# Patient Record
Sex: Female | Born: 1990
Health system: Southern US, Community
[De-identification: ages and names within clinical notes are randomized; demographics above are authoritative.]

---

## 2012-07-19 ENCOUNTER — Ambulatory Visit: Payer: Self-pay | Admitting: Family

## 2012-07-28 ENCOUNTER — Ambulatory Visit: Payer: Self-pay | Admitting: Family

## 2014-12-01 ENCOUNTER — Inpatient Hospital Stay (HOSPITAL_COMMUNITY): Admission: AD | Admit: 2014-12-01 | Payer: Self-pay | Source: Ambulatory Visit | Admitting: Obstetrics and Gynecology

## 2015-04-17 ENCOUNTER — Emergency Department (HOSPITAL_BASED_OUTPATIENT_CLINIC_OR_DEPARTMENT_OTHER)
Admission: EM | Admit: 2015-04-17 | Discharge: 2015-04-17 | Disposition: A | Discharge status: Home / Self Care | Payer: Managed Care, Other (non HMO) | Attending: Emergency Medicine | Admitting: Emergency Medicine

## 2015-04-17 ENCOUNTER — Encounter (HOSPITAL_BASED_OUTPATIENT_CLINIC_OR_DEPARTMENT_OTHER): Payer: Self-pay | Admitting: Emergency Medicine

## 2015-04-17 DIAGNOSIS — H6122 Impacted cerumen, left ear: Secondary | ICD-10-CM | POA: Insufficient documentation

## 2015-04-17 DIAGNOSIS — H9202 Otalgia, left ear: Secondary | ICD-10-CM | POA: Diagnosis present

## 2015-04-17 NOTE — ED Notes (Signed)
Pt refused second ear irrigation

## 2015-04-17 NOTE — ED Provider Notes (Signed)
CSN: [id number]     Arrival date & time 04/17/15  1840 History  By signing my name below, I, Phillis Haggis, attest that this documentation has been prepared under the direction and in the presence of Geoffery Lyons, MD. Electronically Signed: Phillis Haggis, ED Scribe. 04/17/2015. 6:55 PM.  Chief Complaint  Patient presents with  . Otalgia   Patient is a 24 y.o. female presenting with ear pain. The history is provided by the patient. No language interpreter was used.  Otalgia Location:  Left Quality:  Pressure (fullness) Severity:  Mild Onset quality:  Sudden Duration:  3 days Timing:  Constant Progression:  Worsening Chronicity:  New Context: foreign body (Q-Tip)   Ineffective treatments:  None tried Associated symptoms: hearing loss (mild)   Associated symptoms: no ear discharge   HPI Comments: Ashley Thompson is a 24 y.o. female who presents to the Emergency Department complaining of gradually worsening, constant left otalgia and "fullness" onset 3 days ago. Pt states that she may have pushed earwax into her ear canal after using a Q-Tip and has been feeling pain ever since. She reports that it is hard to hear due to the "fullness." She states hx of similar symptoms, but that it typically can pop on her own. She denies trying anything for her symptoms. She denies drainage from the area or right otalgia.   History reviewed. No pertinent past medical history. History reviewed. No pertinent past surgical history. History reviewed. No pertinent family history. Social History  Substance Use Topics  . Smoking status: Never Smoker   . Smokeless tobacco: None  . Alcohol Use: No   OB History    No data available     Review of Systems  HENT: Positive for ear pain and hearing loss (mild). Negative for ear discharge.   All other systems reviewed and are negative.  Allergies  Review of patient's allergies indicates no known allergies.  Home Medications   Prior to Admission  medications   Not on File   BP 130/93 mmHg  Pulse 81  Temp(Src) 98.5 F (36.9 C) (Oral)  Resp 16  Ht  (1.626 m)  Wt 265 lb (120.203 kg)  BMI 45.46 kg/m2  SpO2 100%  LMP 03/23/2015 (Approximate)   Physical Exam  Constitutional: She is oriented to person, place, and time. She appears well-developed and well-nourished.  HENT:  Head: Normocephalic.  Left ear canal has what appears to be a cerumen impaction, obscuring the TM  Eyes: EOM are normal.  Neck: Normal range of motion.  Pulmonary/Chest: Effort normal.  Abdominal: She exhibits no distension.  Musculoskeletal: Normal range of motion.  Neurological: She is alert and oriented to person, place, and time.  Psychiatric: She has a normal mood and affect.  Nursing note and vitals reviewed.   ED Course  Procedures (including critical care time) DIAGNOSTIC STUDIES: Oxygen Saturation is 100% on RA, normal by my interpretation.    COORDINATION OF CARE: 6:57 PM-Discussed treatment plan which includes removal of cerumen impaction with pt at bedside and pt agreed to plan.    Labs Review Labs Reviewed - No data to display  Imaging Review No results found. I have personally reviewed and evaluated these images and lab results as part of my medical decision-making.   EKG Interpretation None      MDM   Final diagnoses:  None    Patient has a cerumen impaction. She allowed [place] to attempt one irrigation which relieved some earwax. There is still a cerumen  impaction present. A second attempt was to be made, however the patient began to hyperventilate and nearly experienced a panic attack. She is refusing further attempts. At this point, she will be discharged with instructions to purchase over-the-counter Debrox. She can return as needed.  I personally performed the services described in this documentation, which was scribed in my presence. The recorded information has been reviewed and is accurate.       Geoffery Lyonsouglas Nykia Turko,  MD 04/17/15 2013

## 2015-04-17 NOTE — ED Notes (Signed)
Patient states that she woke up a few days ago and has some pressure and "fullness" to her left ear

## 2015-04-17 NOTE — Discharge Instructions (Signed)
You can purchase over-the-counter Debrox for ear wax removal.  Return to the ER if you experience further problems.   Cerumen Impaction The structures of the external ear canal secrete a waxy substance known as cerumen. Excess cerumen can build up in the ear canal, causing a condition known as cerumen impaction. Cerumen impaction can cause ear pain and disrupt the function of the ear. The rate of cerumen production differs for each individual. In certain individuals, the configuration of the ear canal may decrease his or her ability to naturally remove cerumen. CAUSES Cerumen impaction is caused by excessive cerumen production or buildup. RISK FACTORS  Frequent use of swabs to clean ears.  Having narrow ear canals.  Having eczema.  Being dehydrated. SIGNS AND SYMPTOMS  Diminished hearing.  Ear drainage.  Ear pain.  Ear itch. TREATMENT Treatment may involve:  Over-the-counter or prescription ear drops to soften the cerumen.  Removal of cerumen by a health care provider. This may be done with:  Irrigation with warm water. This is the most common method of removal.  Ear curettes and other instruments.  Surgery. This may be done in severe cases. HOME CARE INSTRUCTIONS  Take medicines only as directed by your health care provider.  Do not insert objects into the ear with the intent of cleaning the ear. PREVENTION  Do not insert objects into the ear, even with the intent of cleaning the ear. Removing cerumen as a part of normal hygiene is not necessary, and the use of swabs in the ear canal is not recommended.  Drink enough water to keep your urine clear or pale yellow.  Control your eczema if you have it. SEEK MEDICAL CARE IF:  You develop ear pain.  You develop bleeding from the ear.  The cerumen does not clear after you use ear drops as directed.   This information is not intended to replace advice given to you by your health care provider. Make sure you  discuss any questions you have with your health care provider.   Document Released: 06/12/2004 Document Revised: 05/26/2014 Document Reviewed: 12/20/2014 Elsevier Interactive Patient Education [employer]2016 [employer]

## 2015-04-19 ENCOUNTER — Emergency Department (HOSPITAL_BASED_OUTPATIENT_CLINIC_OR_DEPARTMENT_OTHER)
Admission: EM | Admit: 2015-04-19 | Discharge: 2015-04-19 | Disposition: A | Discharge status: Home / Self Care | Payer: Managed Care, Other (non HMO) | Attending: Emergency Medicine | Admitting: Emergency Medicine

## 2015-04-19 ENCOUNTER — Encounter (HOSPITAL_BASED_OUTPATIENT_CLINIC_OR_DEPARTMENT_OTHER): Payer: Self-pay | Admitting: *Deleted

## 2015-04-19 DIAGNOSIS — R200 Anesthesia of skin: Secondary | ICD-10-CM | POA: Diagnosis not present

## 2015-04-19 DIAGNOSIS — F419 Anxiety disorder, unspecified: Secondary | ICD-10-CM | POA: Diagnosis present

## 2015-04-19 DIAGNOSIS — R0602 Shortness of breath: Secondary | ICD-10-CM | POA: Diagnosis not present

## 2015-04-19 DIAGNOSIS — F410 Panic disorder [episodic paroxysmal anxiety] without agoraphobia: Secondary | ICD-10-CM | POA: Insufficient documentation

## 2015-04-19 DIAGNOSIS — H538 Other visual disturbances: Secondary | ICD-10-CM | POA: Diagnosis not present

## 2015-04-19 DIAGNOSIS — R110 Nausea: Secondary | ICD-10-CM | POA: Insufficient documentation

## 2015-04-19 DIAGNOSIS — R000 Tachycardia, unspecified: Secondary | ICD-10-CM | POA: Diagnosis not present

## 2015-04-19 DIAGNOSIS — R51 Headache: Secondary | ICD-10-CM | POA: Insufficient documentation

## 2015-04-19 LAB — CBC WITH DIFFERENTIAL/PLATELET
BASOS PCT: 0 %
Basophils Absolute: 0 10*3/uL (ref 0.0–0.1)
EOS ABS: 0 10*3/uL (ref 0.0–0.7)
Eosinophils Relative: 0 %
HCT: 35 % — ABNORMAL LOW (ref 36.0–46.0)
Hemoglobin: 10.6 g/dL — ABNORMAL LOW (ref 12.0–15.0)
LYMPHS ABS: 1.5 10*3/uL (ref 0.7–4.0)
Lymphocytes Relative: 20 %
MCH: 19.6 pg — AB (ref 26.0–34.0)
MCHC: 30.3 g/dL (ref 30.0–36.0)
MCV: 64.6 fL — AB (ref 78.0–100.0)
MONO ABS: 0.5 10*3/uL (ref 0.1–1.0)
Monocytes Relative: 7 %
NEUTROS PCT: 73 %
Neutro Abs: 5.7 10*3/uL (ref 1.7–7.7)
PLATELETS: 313 10*3/uL (ref 150–400)
RBC: 5.42 MIL/uL — ABNORMAL HIGH (ref 3.87–5.11)
RDW: 16.5 % — AB (ref 11.5–15.5)
WBC: 7.7 10*3/uL (ref 4.0–10.5)

## 2015-04-19 LAB — BASIC METABOLIC PANEL
Anion gap: 7 (ref 5–15)
BUN: 12 mg/dL (ref 6–20)
CALCIUM: 9.3 mg/dL (ref 8.9–10.3)
CO2: 26 mmol/L (ref 22–32)
CREATININE: 0.6 mg/dL (ref 0.44–1.00)
Chloride: 105 mmol/L (ref 101–111)
GFR calc Af Amer: >60 mL/min (ref >60)
GLUCOSE: 112 mg/dL — AB (ref 65–99)
Potassium: 3.9 mmol/L (ref 3.5–5.1)
Sodium: 138 mmol/L (ref 135–145)

## 2015-04-19 MED ORDER — KETOROLAC TROMETHAMINE 30 MG/ML IJ SOLN
30.0000 mg | Freq: Once | INTRAMUSCULAR | Status: AC
  Administered 2015-04-19: 30 mg via INTRAVENOUS
  Filled 2015-04-19: qty 1

## 2015-04-19 MED ORDER — SODIUM CHLORIDE 0.9 % IV BOLUS (SEPSIS)
1000.0000 mL | Freq: Once | INTRAVENOUS | Status: AC
  Administered 2015-04-19: 1000 mL via INTRAVENOUS

## 2015-04-19 NOTE — ED Provider Notes (Signed)
CSN: [id number]     Arrival date & time 04/19/15  1155 History   First MD Initiated Contact with Patient 04/19/15 1247     Chief Complaint  Patient presents with  . Panic Attack     (Consider location/radiation/quality/duration/timing/severity/associated sxs/prior Treatment) HPI   Patient is a 24 year old female who presents to the ED with complaint of sudden onset facial numbness, breathing fast, nausea, blurred vision and feeling anxious, onset 1 hour PTA. Patient reports she was sitting at work when she had sudden onset of the systems listed above. She reports when she arrived to the ED her symptoms had resolved but she began having a gradual onset left-sided frontal headache that she described as a throbbing/aching pain. Denies fever, chills, lightheadedness, dizziness, cough, shortness of breath, wheezing, chest pain, abdominal pain, vomiting, diarrhea, urinary symptoms, weakness, syncope. Denies photophobia or phonophobia. Denies taking any medications prior to arrival. Patient denies history of similar episode of symptoms. She notes she has been more stressed recently.   History reviewed. No pertinent past medical history. History reviewed. No pertinent past surgical history. No family history on file. Social History  Substance Use Topics  . Smoking status: Never Smoker   . Smokeless tobacco: None  . Alcohol Use: No   OB History    No data available     Review of Systems  Eyes: Positive for visual disturbance (blurred vision).  Respiratory: Positive for shortness of breath.   Gastrointestinal: Positive for nausea.  Neurological: Positive for numbness and headaches.  Psychiatric/Behavioral: The patient is nervous/anxious.   All other systems reviewed and are negative.     Allergies  Review of patient's allergies indicates no known allergies.  Home Medications   Prior to Admission medications   Not on File   BP 118/50 mmHg  Pulse 94  Temp(Src) 98.4 F (36.9 C)  (Oral)  Resp 16  Ht 5\' 4"  (1.626 m)  Wt 120.203 kg  BMI 45.46 kg/m2  SpO2 100%  LMP 03/23/2015 (Approximate) Physical Exam  Constitutional: She is oriented to person, place, and time. She appears well-developed and well-nourished. No distress.  Pt resting in bed comfortably.   HENT:  Head: Normocephalic and atraumatic.  Right Ear: Tympanic membrane normal.  Left Ear: Tympanic membrane normal.  Nose: Nose normal. Right sinus exhibits no maxillary sinus tenderness and no frontal sinus tenderness. Left sinus exhibits no maxillary sinus tenderness and no frontal sinus tenderness.  Mouth/Throat: Uvula is midline, oropharynx is clear and moist and mucous membranes are normal. No oropharyngeal exudate.  Eyes: Conjunctivae and EOM are normal. Pupils are equal, round, and reactive to light. Right eye exhibits no discharge. Left eye exhibits no discharge. No scleral icterus.  Neck: Normal range of motion. Neck supple.  Cardiovascular: Regular rhythm, normal heart sounds and intact distal pulses.   Tachycardic  Pulmonary/Chest: Effort normal and breath sounds normal. No respiratory distress. She has no wheezes. She has no rales. She exhibits no tenderness.  Abdominal: Soft. Bowel sounds are normal. She exhibits no distension and no mass. There is no tenderness. There is no rebound and no guarding.  Musculoskeletal: Normal range of motion. She exhibits no edema or tenderness.  Lymphadenopathy:    She has no cervical adenopathy.  Neurological: She is alert and oriented to person, place, and time. She has normal strength and normal reflexes. No cranial nerve deficit or sensory deficit. Coordination and gait normal.  Skin: Skin is warm and dry. She is not diaphoretic.  Nursing note and vitals  reviewed.   ED Course  Procedures (including critical care time) Labs Review Labs Reviewed  CBC WITH DIFFERENTIAL/PLATELET - Abnormal; Notable for the following:    RBC 5.42 (*)    Hemoglobin 10.6 (*)     HCT 35.0 (*)    MCV 64.6 (*)    MCH 19.6 (*)    RDW 16.5 (*)    All other components within normal limits  BASIC METABOLIC PANEL - Abnormal; Notable for the following:    Glucose, Bld 112 (*)    All other components within normal limits    Imaging Review No results found. I have personally reviewed and evaluated these images and lab results as part of my medical decision-making.   EKG Interpretation   Date/Time:  Thursday April 19 2015 12:17:22 EST Ventricular Rate:  113 PR Interval:  144 QRS Duration: 82 QT Interval:  332 QTC Calculation: 455 R Axis:   16 Text Interpretation:  Sinus tachycardia Otherwise normal ECG No previous  tracing Confirmed by Anitra Lauth  MD, Alphonzo Lemmings ([id number]) on 04/19/2015 12:36:13  PM     Filed Vitals:   04/19/15 1207 04/19/15 1434  BP: 168/94 118/50  Pulse: 120 94  Temp: 98.4 F (36.9 C)   Resp: 26 16    MDM   Final diagnoses:  Panic attack    Patient presents with facial numbness, rapid breathing, feeling anxious, blurred vision and nausea which she notes has since resolved since arrival to the ED. At this time she reports having new onset of headache. Denies fever or visual changes. HR 120, normotensive. Exam unremarkable, no neuro deficits. Patient given IV fluids and Toradol. EKG showed sinus tachycardia. CBC revealed Hgb 10.6 and parasites. Discussed results with patient. She reports having history of anemia associated with her last pregnancy. Patient also reports all of her symptoms have resolved at this time. I have a low suspicion for ACS, PE, dissection, or other acute cardiac event at this time. I suspect patient's symptoms are likely due to to panic attack. Patient reports having increased stress recently. Plan to discharge patient home. Advised pt to follow up with her PCP regarding management of her increased stress/anxiety and further evaluation for anemia and spherocytes seen on labs today.   Evaluation does not show pathology  requring ongoing emergent intervention or admission. Pt is hemodynamically stable and mentating appropriately. All questions answered. Return precautions discussed and outpatient follow up given.    Satira Sark [place], [place] 04/19/15 2302  Gwyneth Sprout, MD 04/20/15 [phone]

## 2015-04-19 NOTE — ED Notes (Addendum)
Sudden onset of facial numbness, blurred vision, breathing fast, panic feeling.

## 2015-04-19 NOTE — Discharge Instructions (Signed)
Follow up with your primary care provider regarding your increased stress and lab abnormality.  Please return to the Emergency Department if symptoms worsen or new onset of fever, chills, headache, change in vision, numbness, tingling, weakness, chest pain, difficulty breathing, syncope.

## 2015-04-19 NOTE — ED Notes (Signed)
PA to bedside

## 2015-09-11 ENCOUNTER — Encounter (HOSPITAL_BASED_OUTPATIENT_CLINIC_OR_DEPARTMENT_OTHER): Payer: Self-pay | Admitting: Emergency Medicine

## 2015-09-11 ENCOUNTER — Emergency Department (HOSPITAL_BASED_OUTPATIENT_CLINIC_OR_DEPARTMENT_OTHER): Payer: Self-pay

## 2015-09-11 ENCOUNTER — Emergency Department (HOSPITAL_BASED_OUTPATIENT_CLINIC_OR_DEPARTMENT_OTHER)
Admission: EM | Admit: 2015-09-11 | Discharge: 2015-09-12 | Disposition: A | Discharge status: Home / Self Care | Payer: Self-pay | Attending: Emergency Medicine | Admitting: Emergency Medicine

## 2015-09-11 DIAGNOSIS — Z0000 Encounter for general adult medical examination without abnormal findings: Secondary | ICD-10-CM | POA: Insufficient documentation

## 2015-09-11 LAB — PREGNANCY, URINE: PREG TEST UR: NEGATIVE

## 2015-09-11 NOTE — ED Notes (Signed)
Pt states right side of faces has felt like it is tingling for 3 weeks

## 2015-09-11 NOTE — ED Provider Notes (Signed)
CSN: [id number]     Arrival date & time 09/11/15  2115 History  By signing my name below, I, Ashley Thompson, attest that this documentation has been prepared under the direction and in the presence of Jonay Hitchcock, MD. Electronically Signed: [name], ED Scribe. 09/11/2015. 11:20 PM.    Chief Complaint  Patient presents with  . Tingling   Patient is a 25 y.o. female presenting with general illness. The history is provided by the patient and a parent. No language interpreter was used.  Illness Location:  Right face Quality:  Tingling Severity:  Moderate Onset quality:  Gradual Duration:  3 weeks Timing:  Intermittent Progression:  Worsening Chronicity:  New Relieved by:  Time Associated symptoms: no abdominal pain, no loss of consciousness, no sore throat, no vomiting and no wheezing    HPI Comments: Ashley Thompson is a 25 y.o. female who presents to the Emergency Department complaining of intermittent right-sided facial tingling onset 3 weeks ago. Pt states each episode will last for up to 45 minutes, with her feeling as though the right side of her face is droopy and stiff. She notes that when she looked in the mirror, she did not see any visual evidence of this feeling. She reports that the stiffness worsened significantly tonight, which is why she came to the ED. She notes that this has since resolved once more. She states her PCP is Dr Darnelle SpangleHenry Dorne.   History reviewed. No pertinent past medical history. History reviewed. No pertinent past surgical history. History reviewed. No pertinent family history. Social History  Substance Use Topics  . Smoking status: Never Smoker   . Smokeless tobacco: None  . Alcohol Use: Yes   OB History    No data available     Review of Systems  HENT: Negative for sore throat.   Respiratory: Negative for wheezing.   Gastrointestinal: Negative for vomiting and abdominal pain.  Neurological: Negative for loss of consciousness.  All other  systems reviewed and are negative.   Allergies  Review of patient's allergies indicates no known allergies.  Home Medications   Prior to Admission medications   Not on File   BP 110/94 mmHg  Pulse 97  Temp(Src) 98.8 F (37.1 C) (Oral)  Resp 20  Ht 5\' 4"  (1.626 m)  Wt 280 lb (127.007 kg)  BMI 48.04 kg/m2  SpO2 100%  LMP 07/29/2015 Physical Exam  Constitutional: She is oriented to person, place, and time. She appears well-developed and well-nourished.  HENT:  Head: Normocephalic and atraumatic.  Mouth/Throat: Oropharynx is clear and moist.  Eyes: Conjunctivae and EOM are normal. Pupils are equal, round, and reactive to light. Right eye exhibits no discharge. Left eye exhibits no discharge.  Neck: Normal range of motion. Neck supple.  No bruits  Cardiovascular: Normal rate, regular rhythm and normal heart sounds.   Pulmonary/Chest: Effort normal and breath sounds normal. No stridor. No respiratory distress. She has no wheezes. She has no rales.  Abdominal: Soft. Bowel sounds are normal. There is no tenderness. There is no rebound and no guarding.  Musculoskeletal: Normal range of motion. She exhibits no edema or tenderness.  Lymphadenopathy:    She has no cervical adenopathy.  Neurological: She is alert and oriented to person, place, and time. She has normal reflexes. She displays normal reflexes. No cranial nerve deficit. Coordination normal.  Face is completely symmetric, cranial nerves 2-12 are intact, 5/5 DTRs normal and intact  Skin: Skin is warm and dry. No rash noted.  She is not diaphoretic. No erythema.  Psychiatric: She has a normal mood and affect.  Nursing note and vitals reviewed.   ED Course  Procedures  DIAGNOSTIC STUDIES: Oxygen Saturation is 100% on RA, normal by my interpretation.    COORDINATION OF CARE: 11:18 PM - Discussed plans to order a head CT. Pt advised of plan for treatment and pt agrees.  Labs Review Labs Reviewed  PREGNANCY, URINE     Imaging Review No results found. I have personally reviewed and evaluated these images and lab results as part of my medical decision-making.   EKG Interpretation None      MDM   Final diagnoses:  None    Filed Vitals:   09/11/15 2118  BP: 110/94  Pulse: 97  Temp: 98.8 F (37.1 C)  Resp: 20   Results for orders placed or performed during the hospital encounter of 09/11/15  Pregnancy, urine  Result Value Ref Range   Preg Test, Ur NEGATIVE NEGATIVE   Ct Head Wo Contrast  09/12/2015  CLINICAL DATA:  Face feels stiff.  Right face tingling. EXAM: CT HEAD WITHOUT CONTRAST TECHNIQUE: Contiguous axial images were obtained from the base of the skull through the vertex without intravenous contrast. COMPARISON:  None. FINDINGS: Skull and Sinuses:Negative for fracture or destructive process. Smooth asymmetric enlargement of the left foramen ovale which is likely developmental. This finding is contralateral to the patient's face symptoms. Visualized orbits: Negative. Brain: Normal. No evidence of acute infarction, hemorrhage, hydrocephalus, or mass lesion/mass effect. IMPRESSION: Negative study.  Normal appearance of the brain. Electronically Signed   By: Marnee Spring M.D.   On: 09/12/2015 00:09     Well appearing low suspicion for any neurologic phenomenon.  Follow up with your PMD Suspect anxiety as source   I personally performed the services described in this documentation, which was scribed in my presence. The recorded information has been reviewed and is accurate.     Cy Blamer, MD 09/12/15 [phone]

## 2015-09-12 ENCOUNTER — Encounter (HOSPITAL_BASED_OUTPATIENT_CLINIC_OR_DEPARTMENT_OTHER): Payer: Self-pay | Admitting: Emergency Medicine

## 2015-10-26 ENCOUNTER — Encounter (HOSPITAL_BASED_OUTPATIENT_CLINIC_OR_DEPARTMENT_OTHER): Payer: Self-pay | Admitting: Emergency Medicine

## 2015-10-26 ENCOUNTER — Emergency Department (HOSPITAL_BASED_OUTPATIENT_CLINIC_OR_DEPARTMENT_OTHER)
Admission: EM | Admit: 2015-10-26 | Discharge: 2015-10-26 | Disposition: A | Discharge status: Home / Self Care | Payer: Managed Care, Other (non HMO) | Attending: Emergency Medicine | Admitting: Emergency Medicine

## 2015-10-26 DIAGNOSIS — N939 Abnormal uterine and vaginal bleeding, unspecified: Secondary | ICD-10-CM

## 2015-10-26 DIAGNOSIS — D649 Anemia, unspecified: Secondary | ICD-10-CM | POA: Insufficient documentation

## 2015-10-26 LAB — CBC WITH DIFFERENTIAL/PLATELET
BASOS PCT: 0 %
Basophils Absolute: 0 10*3/uL (ref 0.0–0.1)
EOS PCT: 1 %
Eosinophils Absolute: 0.1 10*3/uL (ref 0.0–0.7)
HEMATOCRIT: 28.9 % — AB (ref 36.0–46.0)
HEMOGLOBIN: 8.7 g/dL — AB (ref 12.0–15.0)
LYMPHS ABS: 2.8 10*3/uL (ref 0.7–4.0)
Lymphocytes Relative: 32 %
MCH: 18.8 pg — ABNORMAL LOW (ref 26.0–34.0)
MCHC: 30.1 g/dL (ref 30.0–36.0)
MCV: 62.3 fL — AB (ref 78.0–100.0)
MONOS PCT: 7 %
Monocytes Absolute: 0.6 10*3/uL (ref 0.1–1.0)
Neutro Abs: 5.3 10*3/uL (ref 1.7–7.7)
Neutrophils Relative %: 60 %
Platelets: 342 10*3/uL (ref 150–400)
RBC: 4.64 MIL/uL (ref 3.87–5.11)
RDW: 17.3 % — ABNORMAL HIGH (ref 11.5–15.5)
WBC: 8.8 10*3/uL (ref 4.0–10.5)

## 2015-10-26 LAB — HEMOGLOBIN AND HEMATOCRIT, BLOOD
HCT: 28.5 % — ABNORMAL LOW (ref 36.0–46.0)
HEMOGLOBIN: 8.5 g/dL — AB (ref 12.0–15.0)

## 2015-10-26 LAB — URINALYSIS, ROUTINE W REFLEX MICROSCOPIC
Bilirubin Urine: NEGATIVE
Glucose, UA: NEGATIVE mg/dL
KETONES UR: NEGATIVE mg/dL
Nitrite: NEGATIVE
PROTEIN: NEGATIVE mg/dL
SPECIFIC GRAVITY, URINE: 1.024 (ref 1.005–1.030)
pH: 5.5 (ref 5.0–8.0)

## 2015-10-26 LAB — WET PREP, GENITAL
CLUE CELLS WET PREP: NONE SEEN
SPERM: NONE SEEN
TRICH WET PREP: NONE SEEN
Yeast Wet Prep HPF POC: NONE SEEN

## 2015-10-26 LAB — PREGNANCY, URINE: Preg Test, Ur: NEGATIVE

## 2015-10-26 LAB — URINE MICROSCOPIC-ADD ON

## 2015-10-26 MED ORDER — FERROUS SULFATE 325 (65 FE) MG PO TABS: 325.0000 mg | ORAL_TABLET | Freq: Every day | ORAL | Status: DC

## 2015-10-26 MED ORDER — MEGESTROL ACETATE 40 MG PO TABS: 40.0000 mg | ORAL_TABLET | Freq: Every day | ORAL | Status: DC

## 2015-10-26 NOTE — ED Provider Notes (Signed)
CSN: [id number]     Arrival date & time 10/26/15  1341 History   First MD Initiated Contact with Patient 10/26/15 1425     Chief Complaint  Patient presents with  . Vaginal Bleeding     (Consider location/radiation/quality/duration/timing/severity/associated sxs/prior Treatment) HPI Comments: Ashley Thompson is a 25 y.o. female G1P1001 presents to ED with complaint of vaginal bleeding. Patient states she has had vaginal bleeding since April 24th without relief. She was originally seen in the ED at initial onset and encouraged to follow up with OBGYN; however, cost prohibited her from following up with OBGYN. Patient has associated light headedness. She denies fever, chills, night sweats, abdominal pain, pelvic pain, vaginal pain, vaginal discharge, nausea, vomiting, diarrhea, constipation, dizziness, shortness of breath, or urinary symptoms. She states she has a history of irregular periods. No history of ovarian cysts, uterine fibroids, or polyps. She has had one pregnancy - outcome term vaginal delivery, uncomplicated. Denies other gynecologic conditions. No abdominal conditions or surgeries. No bleeding disorders.     Patient is a 25 y.o. female presenting with vaginal bleeding. The history is provided by the patient.  Vaginal Bleeding   History reviewed. No pertinent past medical history. History reviewed. No pertinent past surgical history. History reviewed. No pertinent family history. Social History  Substance Use Topics  . Smoking status: Never Smoker   . Smokeless tobacco: None  . Alcohol Use: Yes   OB History    No data available     Review of Systems  Genitourinary: Positive for vaginal bleeding.  Neurological: Positive for light-headedness.  All other systems reviewed and are negative.     Allergies  Review of patient's allergies indicates no known allergies.  Home Medications   Prior to Admission medications   Medication Sig Start Date End Date Taking? Authorizing  Provider  ferrous sulfate 325 (65 FE) MG tablet Take 1 tablet (325 mg total) by mouth daily. 10/26/15   Lona Kettle, PA-C  megestrol (MEGACE) 40 MG tablet Take 1 tablet (40 mg total) by mouth daily. 10/26/15   Lona Kettle, PA-C   BP 104/63 mmHg  Pulse 92  Temp(Src) 98.2 F (36.8 C) (Oral)  Resp 14  Ht  (1.651 m)  Wt 129.275 kg  BMI 47.43 kg/m2  SpO2 94% Physical Exam  Constitutional: She appears well-developed and well-nourished. No distress.  HENT:  Head: Normocephalic and atraumatic.  Mouth/Throat: Oropharynx is clear and moist. No oropharyngeal exudate.  Eyes: Conjunctivae are normal. Pupils are equal, round, and reactive to light. No scleral icterus.  Neck: Normal range of motion.  Cardiovascular: Normal rate and normal heart sounds.   Pulmonary/Chest: Effort normal and breath sounds normal. No respiratory distress.  Abdominal: Soft. Bowel sounds are normal. She exhibits no distension. There is no tenderness. There is no rebound, no guarding and no CVA tenderness.  Obese abdomen.   Genitourinary: Pelvic exam was performed with patient supine.  Chaperone present for duration of exam. No masses, lesions, or ulcerations of external genitalia. No masses, lesions, or ulcerations of vaginal cavity. Blood noted in vaginal cavity. Cervix is closed with blood discharge. No clots. No CMT or masses palpated on bimanual exam.   Neurological: She is alert.  Skin: Skin is warm and dry. She is not diaphoretic.  Psychiatric: She has a normal mood and affect. Her behavior is normal.    ED Course  Procedures (including critical care time) Labs Review Labs Reviewed  WET PREP, GENITAL - Abnormal; Notable for the  following:    WBC, Wet Prep HPF POC FEW (*)    All other components within normal limits  URINALYSIS, ROUTINE W REFLEX MICROSCOPIC (NOT AT [facility]) - Abnormal; Notable for the following:    APPearance CLOUDY (*)    Hgb urine dipstick LARGE (*)    Leukocytes, UA TRACE  (*)    All other components within normal limits  URINE MICROSCOPIC-ADD ON - Abnormal; Notable for the following:    Squamous Epithelial / LPF 0-5 (*)    Bacteria, UA MANY (*)    All other components within normal limits  CBC WITH DIFFERENTIAL/PLATELET - Abnormal; Notable for the following:    Hemoglobin 8.7 (*)    HCT 28.9 (*)    MCV 62.3 (*)    MCH 18.8 (*)    RDW 17.3 (*)    All other components within normal limits  PREGNANCY, URINE  HEMOGLOBIN AND HEMATOCRIT, BLOOD  GC/CHLAMYDIA PROBE AMP ([place]) NOT AT [facility]    Imaging Review No results found. I have personally reviewed and evaluated these images and lab results as part of my medical decision-making.   EKG Interpretation None      MDM   Final diagnoses:  Vaginal bleeding  Anemia, unspecified anemia type    Patient is afebrile and non-toxic appearing. Vital signs are stable. Patient is orthostatic, encouraged to drink fluids. Pelvic exam remarkable for bleeding, cervix is closed, no CMT. Concern for ectopic vs. Ovarian cyst vs. Uterine fibroids vs. Uterine poylps. Low suspicion for ovarian torsion given no vaginal pain, pelvic pain, no n/v, and no palpable masses on bimanual. Will check pregnancy test, UA, CBC, wet prep, GC/Chlamydia.   Pregnancy test negative. U/A shows bacteria and leukocytes; however, squamous epithelial present and patient denies urinary symptoms - suspect contamination. Wet prep re-assuring. GC/Chlamydia pending. CBC remarkable for hemoglobin 8.7, a 2 unit drop in 6 months. Will call [facility] for guidance.   [facility] Recommends Megace 40mg  and will put in request for patient [facility]   Will recheck hemoglobin in 4 hours to ensure not acute drop in hgb. If stable, patient to be discharged on Megace and iron supplementation. [facility] next week for re-evaluation. Return precautions provided to patient. At shift  change discussed patient with Buel ReamAlexandra Law, PA-C, who assumed care.    Lona KettleAshley Laurel Joniah Bednarski, PA-C 10/26/15 1857  [address] Overlook CourtAshley Laurel Daphane ShepherdMeyer, [place] JerseyPA-C 10/26/15 2113  Jerelyn ScottMartha Linker, MD 11/02/15 [phone]

## 2015-10-26 NOTE — Discharge Instructions (Signed)
Read the information below.   Your lab work shows you have anemia. Use the prescribed medication as directed.  Please discuss all new medications with your pharmacist. You are being prescribed megace. Take as prescribed. You are also prescribed iron supplementation.  You should receive a call from [facility] outpatient clinic for an appointment. If you do not hear from them in the next week, be sure to call and schedule an appointment.    You may return to the Emergency Department at any time for worsening condition or any new symptoms that concern you. Return to ED if your symptoms worsen or you develop shortness of breath, dizziness, light headedness, loss of consciousness, or fatigue.    Abnormal Uterine Bleeding Abnormal uterine bleeding means bleeding from the vagina that is not your normal menstrual period. This can be:  Bleeding or spotting between periods.  Bleeding after sex (sexual intercourse).  Bleeding that is heavier or more than normal.  Periods that last longer than usual.  Bleeding after menopause. There are many problems that may cause this. Treatment will depend on the cause of the bleeding. Any kind of bleeding that is not normal should be reviewed by your doctor.  HOME CARE Watch your condition for any changes. These actions may lessen any discomfort you are having:  Do not use tampons or douches as told by your doctor.  Change your pads often. You should get regular pelvic exams and Pap tests. Keep all appointments for tests as told by your doctor. GET HELP IF:  You are bleeding for more than 1 week.  You feel dizzy at times. GET HELP RIGHT AWAY IF:   You pass out.  You have to change pads every 15 to 30 minutes.  You have belly pain.  You have a fever.  You become sweaty or weak.  You are passing large blood clots from the vagina.  You feel sick to your stomach (nauseous) and throw up (vomit). MAKE SURE YOU:  Understand these instructions.  Will  watch your condition.  Will get help right away if you are not doing well or get worse.   This information is not intended to replace advice given to you by your health care provider. Make sure you discuss any questions you have with your health care provider.   Document Released: 03/02/2009 Document Revised: 05/10/2013 Document Reviewed: 12/02/2012 Elsevier Interactive Patient Education 2016 [employer].  Anemia, Nonspecific Anemia is a condition in which the concentration of red blood cells or hemoglobin in the blood is below normal. Hemoglobin is a substance in red blood cells that carries oxygen to the tissues of the body. Anemia results in not enough oxygen reaching these tissues.  CAUSES  Common causes of anemia include:   Excessive bleeding. Bleeding may be internal or external. This includes excessive bleeding from periods (in women) or from the intestine.   Poor nutrition.   Chronic kidney, thyroid, and liver disease.  Bone marrow disorders that decrease red blood cell production.  Cancer and treatments for cancer.  HIV, AIDS, and their treatments.  Spleen problems that increase red blood cell destruction.  Blood disorders.  Excess destruction of red blood cells due to infection, medicines, and autoimmune disorders. SIGNS AND SYMPTOMS   Minor weakness.   Dizziness.   Headache.  Palpitations.   Shortness of breath, especially with exercise.   Paleness.  Cold sensitivity.  Indigestion.  Nausea.  Difficulty sleeping.  Difficulty concentrating. Symptoms may occur suddenly or they may develop slowly.  DIAGNOSIS  Additional blood tests are often needed. These help your health care provider determine the best treatment. Your health care provider will check your stool for blood and look for other causes of blood loss.  TREATMENT  Treatment varies depending on the cause of the anemia. Treatment can include:   Supplements of iron, vitamin B12, or  folic acid.   Hormone medicines.   A blood transfusion. This may be needed if blood loss is severe.   Hospitalization. This may be needed if there is significant continual blood loss.   Dietary changes.  Spleen removal. HOME CARE INSTRUCTIONS Keep all follow-up appointments. It often takes many weeks to correct anemia, and having your health care provider check on your condition and your response to treatment is very important. SEEK IMMEDIATE MEDICAL CARE IF:   You develop extreme weakness, shortness of breath, or chest pain.   You become dizzy or have trouble concentrating.  You develop heavy vaginal bleeding.   You develop a rash.   You have bloody or black, tarry stools.   You faint.   You vomit up blood.   You vomit repeatedly.   You have abdominal pain.  You have a fever or persistent symptoms for more than 2-3 days.   You have a fever and your symptoms suddenly get worse.   You are dehydrated.  MAKE SURE YOU:  Understand these instructions.  Will watch your condition.  Will get help right away if you are not doing well or get worse.   This information is not intended to replace advice given to you by your health care provider. Make sure you discuss any questions you have with your health care provider.   Document Released: 06/12/2004 Document Revised: 01/05/2013 Document Reviewed: 10/29/2012 Elsevier Interactive Patient Education [employer]2016 Elsevier Inc.

## 2015-10-26 NOTE — ED Notes (Signed)
Patient reports that she has had had heavy vaginal bleeding since April 23rd. Denies any lightheaded or Dizziness

## 2015-10-26 NOTE — ED Provider Notes (Signed)
Sign out from BJ's Wholesaleshley Meyer, PA-C. Recheck H&H in 30 min from 7:12pm to make sure not dropping acutely-, already ordered; if stable, discharge on Megace [facility]. Recheck of hemoglobin 8.5 in comparison to initial 8.7. Not drastically dropping. Patient discharged with Megace and iron with follow-[facility] discussion with OB/GYN. Strict return precautions given. Patient discussed with Dr. Cyndie ChimeNguyen who is in agreement with plan.  Emi Holeslexandra M Ladislaus Repsher, PA-C 10/26/15 2240  Leta BaptistEmily Roe Nguyen, MD 10/29/15 2326

## 2015-10-26 NOTE — ED Notes (Signed)
Pa  at bedside. 

## 2015-10-29 ENCOUNTER — Emergency Department (HOSPITAL_BASED_OUTPATIENT_CLINIC_OR_DEPARTMENT_OTHER): Payer: Self-pay

## 2015-10-29 ENCOUNTER — Emergency Department (HOSPITAL_BASED_OUTPATIENT_CLINIC_OR_DEPARTMENT_OTHER)
Admission: EM | Admit: 2015-10-29 | Discharge: 2015-10-29 | Disposition: A | Discharge status: Home / Self Care | Payer: Self-pay | Attending: Emergency Medicine | Admitting: Emergency Medicine

## 2015-10-29 ENCOUNTER — Encounter (HOSPITAL_BASED_OUTPATIENT_CLINIC_OR_DEPARTMENT_OTHER): Payer: Self-pay | Admitting: *Deleted

## 2015-10-29 DIAGNOSIS — D649 Anemia, unspecified: Secondary | ICD-10-CM | POA: Insufficient documentation

## 2015-10-29 DIAGNOSIS — N939 Abnormal uterine and vaginal bleeding, unspecified: Secondary | ICD-10-CM | POA: Insufficient documentation

## 2015-10-29 LAB — BASIC METABOLIC PANEL
ANION GAP: 7 (ref 5–15)
BUN: 16 mg/dL (ref 6–20)
CALCIUM: 9.2 mg/dL (ref 8.9–10.3)
CO2: 25 mmol/L (ref 22–32)
Chloride: 104 mmol/L (ref 101–111)
Creatinine, Ser: 0.72 mg/dL (ref 0.44–1.00)
Glucose, Bld: 105 mg/dL — ABNORMAL HIGH (ref 65–99)
POTASSIUM: 4 mmol/L (ref 3.5–5.1)
Sodium: 136 mmol/L (ref 135–145)

## 2015-10-29 LAB — CBC WITH DIFFERENTIAL/PLATELET
BASOS ABS: 0 10*3/uL (ref 0.0–0.1)
Basophils Relative: 0 %
Eosinophils Absolute: 0.1 10*3/uL (ref 0.0–0.7)
Eosinophils Relative: 1 %
HEMATOCRIT: 28.6 % — AB (ref 36.0–46.0)
Hemoglobin: 8.5 g/dL — ABNORMAL LOW (ref 12.0–15.0)
LYMPHS ABS: 3.1 10*3/uL (ref 0.7–4.0)
LYMPHS PCT: 34 %
MCH: 18.7 pg — ABNORMAL LOW (ref 26.0–34.0)
MCHC: 29.7 g/dL — ABNORMAL LOW (ref 30.0–36.0)
MCV: 62.9 fL — ABNORMAL LOW (ref 78.0–100.0)
MONOS PCT: 7 %
Monocytes Absolute: 0.6 10*3/uL (ref 0.1–1.0)
NEUTROS PCT: 58 %
Neutro Abs: 5.2 10*3/uL (ref 1.7–7.7)
Platelets: 341 10*3/uL (ref 150–400)
RBC: 4.55 MIL/uL (ref 3.87–5.11)
RDW: 17.2 % — AB (ref 11.5–15.5)
WBC: 9 10*3/uL (ref 4.0–10.5)

## 2015-10-29 LAB — RAPID STREP SCREEN (MED CTR MEBANE ONLY): STREPTOCOCCUS, GROUP A SCREEN (DIRECT): NEGATIVE

## 2015-10-29 MED ORDER — DEXAMETHASONE SODIUM PHOSPHATE 10 MG/ML IJ SOLN
10.0000 mg | Freq: Once | INTRAMUSCULAR | Status: DC
  Filled 2015-10-29: qty 1

## 2015-10-29 MED ORDER — KETOROLAC TROMETHAMINE 60 MG/2ML IM SOLN
60.0000 mg | Freq: Once | INTRAMUSCULAR | Status: DC
  Filled 2015-10-29: qty 2

## 2015-10-29 NOTE — Discharge Instructions (Signed)
Dysfunctional Uterine Bleeding Ashley Thompson, Your blood work today shows hemoglobin 8.5, the same as your last visit.  Chest xray does not show pneumonia.  See your GYN physician tomorrow for further care. Come back to the ED immediately for any worsening symptoms.  Thank you. Dysfunctional uterine bleeding is abnormal bleeding from the uterus. Dysfunctional uterine bleeding includes:  A period that comes earlier or later than usual.  A period that is lighter, heavier, or has blood clots.  Bleeding between periods.  Skipping one or more periods.  Bleeding after sexual intercourse.  Bleeding after menopause. HOME CARE INSTRUCTIONS  Pay attention to any changes in your symptoms. Follow these instructions to help with your condition: Eating  Eat well-balanced meals. Include foods that are high in iron, such as liver, meat, shellfish, green leafy vegetables, and eggs.  If you become constipated:  Drink plenty of water.  Eat fruits and vegetables that are high in water and fiber, such as spinach, carrots, raspberries, apples, and mango. Medicines  Take over-the-counter and prescription medicines only as told by your health care provider.  Do not change medicines without talking with your health care provider.  Aspirin or medicines that contain aspirin may make the bleeding worse. Do not take those medicines:  During the week before your period.  During your period.  If you were prescribed iron pills, take them as told by your health care provider. Iron pills help to replace iron that your body loses because of this condition. Activity  If you need to change your sanitary pad or tampon more than one time every 2 hours:  Lie in bed with your feet raised (elevated).  Place a cold pack on your lower abdomen.  Rest as much as possible until the bleeding stops or slows down.  Do not try to lose weight until the bleeding has stopped and your blood iron level is back to  normal. Other Instructions  For two months, write down:  When your period starts.  When your period ends.  When any abnormal bleeding occurs.  What problems you notice.  Keep all follow up visits as told by your health care provider. This is important. SEEK MEDICAL CARE IF:  You get light-headed or weak.  You have nausea and vomiting.  You cannot eat or drink without vomiting.  You feel dizzy or have diarrhea while you are taking medicines.  You are taking birth control pills or hormones, and you want to change them or stop taking them. SEEK IMMEDIATE MEDICAL CARE IF:  You develop a fever or chills.  You need to change your sanitary pad or tampon more than one time per hour.  Your bleeding becomes heavier, or your flow contains clots more often.  You develop pain in your abdomen.  You lose consciousness.  You develop a rash.   This information is not intended to replace advice given to you by your health care provider. Make sure you discuss any questions you have with your health care provider.   Document Released: 05/02/2000 Document Revised: 01/24/2015 Document Reviewed: 07/31/2014 Elsevier Interactive Patient Education [employer]2016 Elsevier Inc.

## 2015-10-29 NOTE — ED Notes (Addendum)
Pt states she was here Friday for vaginal bleeding. They told her her Hgb was low at that time. Today, she has been dizzy while at work. "Computer screen blurry" Also c/o sore throat. Hard to swallow. "Feel dehydrated. Weak. Sleepy" Was given pills for bleeding, but did not get them filled. Wanted to see OB/GYN first. Appt tomorrow.

## 2015-10-29 NOTE — ED Notes (Signed)
In xray

## 2015-10-29 NOTE — ED Provider Notes (Signed)
CSN: [id number]     Arrival date & time 10/29/15  0025 History   First MD Initiated Contact with Patient 10/29/15 [phone]     Chief Complaint  Patient presents with  . Sore Throat     (Consider location/radiation/quality/duration/timing/severity/associated sxs/prior Treatment) HPI   Ashley Thompson is a 25 y.o. female with no significant past medical history presenting today for worsening symptoms with anemia. Patient states she has been dizzy while at work. She states she has felt weak and dehydrated. She has some shortness of breath as well. She was seen here prior due to vaginal bleeding for the past 6-8 months. She did not fill the prescription medicines that were given to her. She has follow-up with her OB/GYN physician tomorrow. She is waiting for their recommendations. Patient also complains of sore throat and nonproductive cough during the interval. She denies fevers. There are no further complaints.  10 Systems reviewed and are negative for acute change except as noted in the HPI.     History reviewed. No pertinent past medical history. History reviewed. No pertinent past surgical history. History reviewed. No pertinent family history. Social History  Substance Use Topics  . Smoking status: Never Smoker   . Smokeless tobacco: None  . Alcohol Use: Yes   OB History    No data available     Review of Systems    Allergies  Review of patient's allergies indicates no known allergies.  Home Medications   Prior to Admission medications   Medication Sig Start Date End Date Taking? Authorizing Provider  ferrous sulfate 325 (65 FE) MG tablet Take 1 tablet (325 mg total) by mouth daily. 10/26/15   Lona KettleAshley Laurel Meyer, PA-C  megestrol (MEGACE) 40 MG tablet Take 1 tablet (40 mg total) by mouth daily. 10/26/15   Lona KettleAshley Laurel Meyer, PA-C   BP 131/93 mmHg  Pulse 86  Temp(Src) 97.9 F (36.6 C) (Oral)  Resp 18  Ht 5\' 4"  (1.626 m)  Wt 275 lb (124.739 kg)  BMI 47.18 kg/m2  SpO2 100%   LMP 10/29/2015 Physical Exam  Constitutional: She is oriented to person, place, and time. She appears well-developed and well-nourished. No distress.  HENT:  Head: Normocephalic and atraumatic.  Nose: Nose normal.  Mouth/Throat: Oropharynx is clear and moist. No oropharyngeal exudate.  Eyes: Conjunctivae and EOM are normal. Pupils are equal, round, and reactive to light. No scleral icterus.  Neck: Normal range of motion. Neck supple. No JVD present. No tracheal deviation present. No thyromegaly present.  Cardiovascular: Normal rate, regular rhythm and normal heart sounds.  Exam reveals no gallop and no friction rub.   No murmur heard. Pulmonary/Chest: Effort normal and breath sounds normal. No respiratory distress. She has no wheezes. She exhibits no tenderness.  Abdominal: Soft. Bowel sounds are normal. She exhibits no distension and no mass. There is no tenderness. There is no rebound and no guarding.  Musculoskeletal: Normal range of motion. She exhibits no edema or tenderness.  Lymphadenopathy:    She has no cervical adenopathy.  Neurological: She is alert and oriented to person, place, and time. No cranial nerve deficit. She exhibits normal muscle tone.  Skin: Skin is warm and dry. No rash noted. No erythema. No pallor.  Nursing note and vitals reviewed.   ED Course  Procedures (including critical care time) Labs Review Labs Reviewed  CBC WITH DIFFERENTIAL/PLATELET - Abnormal; Notable for the following:    Hemoglobin 8.5 (*)    HCT 28.6 (*)    MCV  62.9 (*)    MCH 18.7 (*)    MCHC 29.7 (*)    RDW 17.2 (*)    All other components within normal limits  BASIC METABOLIC PANEL - Abnormal; Notable for the following:    Glucose, Bld 105 (*)    All other components within normal limits  RAPID STREP SCREEN (NOT AT [facility])  CULTURE, GROUP A STREP [facility])    Imaging Review Dg Chest 2 View  10/29/2015  CLINICAL DATA:  Vaginal bleeding with low hemoglobin. Dizziness today. Sore throat.  Difficulty swallowing. EXAM: CHEST  2 VIEW COMPARISON:  None. FINDINGS: The heart size and mediastinal contours are within normal limits. Both lungs are clear. The visualized skeletal structures are unremarkable. IMPRESSION: No active cardiopulmonary disease. Electronically Signed   By: Burman Nieves M.D.   On: 10/29/2015 03:20   I have personally reviewed and evaluated these images and lab results as part of my medical decision-making.   EKG Interpretation None      MDM   Final diagnoses:  Anemia, unspecified anemia type  Vagina bleeding   Patient presents to the emergency department for worsening anemia and URI symptoms. Hemoglobin is 8.5 which is stable from her last visit. Chest x-rays negative for any pneumonia. She was offered Toradol and Decadron for her pharyngitis which she refused this. Patient advised to see her GYN physician later today during her appointment. She appears well and in no acute distress, vital signs were within her normal limits and she is safe for discharge.    Tomasita Crumble, MD 10/29/15 [phone]

## 2015-10-29 NOTE — ED Notes (Addendum)
Pt states that she does not want to take the toradol or dexmethasone injections at this time. MD aware.

## 2015-10-29 NOTE — ED Notes (Addendum)
Pt reports sore throat and feeling weak all day.  Pt ambulatory without distress.  Reports increased thirst relieved with cold water.  Also reports having anemia and feeling lightheaded today.

## 2015-10-30 ENCOUNTER — Telehealth (HOSPITAL_BASED_OUTPATIENT_CLINIC_OR_DEPARTMENT_OTHER): Payer: Self-pay | Admitting: Emergency Medicine

## 2015-10-30 LAB — GC/CHLAMYDIA PROBE AMP (~~LOC~~) NOT AT ARMC
Chlamydia: NEGATIVE
Neisseria Gonorrhea: POSITIVE — AB

## 2015-10-30 NOTE — Telephone Encounter (Signed)
Chart handoff to EDP for treatment plan for +GC 

## 2015-10-31 LAB — CULTURE, GROUP A STREP (THRC)

## 2015-11-02 ENCOUNTER — Telehealth (HOSPITAL_BASED_OUTPATIENT_CLINIC_OR_DEPARTMENT_OTHER): Payer: Self-pay

## 2015-11-02 NOTE — Telephone Encounter (Signed)
Post ED Visit - Positive Culture Follow-up: Unsuccessful Patient Follow-up  Culture assessed and recommendations reviewed by: []  Enzo BiNathan Batchelder, Pharm.D. []  Celedonio MiyamotoJeremy Frens, Pharm.D., BCPS []  Garvin FilaMike Maccia, Pharm.D. []  Georgina PillionElizabeth Martin, Pharm.D., BCPS []  HazletonMinh Pham, [address] BoulevardPharm.D., BCPS, AAHIVP []  Estella HuskMichelle Turner, Pharm.D., BCPS, AAHIVP []  Tennis Mustassie Stewart, Pharm.D. []  Sherle Poeob Vincent, [place]arm.D.  Positive GC culture  [x]  Patient discharged without antimicrobial prescription and treatment is now indicated []  Organism is resistant to prescribed ED discharge antimicrobial []  Patient with positive blood cultures   Unable to contact patient after 3 attempts, letter will be sent to address on file  Jerry CarasCullom, Jamiah Homeyer Burnett 11/02/2015, 11:10 AM

## 2015-11-08 ENCOUNTER — Telehealth (HOSPITAL_BASED_OUTPATIENT_CLINIC_OR_DEPARTMENT_OTHER): Payer: Self-pay | Admitting: *Deleted

## 2015-11-08 NOTE — Telephone Encounter (Signed)
Spoke with patient, verified ID, informed of labs, Cefixime 400mg  POx 1 and Z[facility]ax 1 gram PO x [facility] [place], [phone], DHHS form faxed, patient informed to abstain for sexual activity x 10 days and notify sexual partners for evaluation and treatment

## 2016-04-23 ENCOUNTER — Encounter (HOSPITAL_BASED_OUTPATIENT_CLINIC_OR_DEPARTMENT_OTHER): Payer: Self-pay | Admitting: Emergency Medicine

## 2016-04-23 ENCOUNTER — Emergency Department (HOSPITAL_BASED_OUTPATIENT_CLINIC_OR_DEPARTMENT_OTHER)
Admission: EM | Admit: 2016-04-23 | Discharge: 2016-04-23 | Disposition: A | Discharge status: Home / Self Care | Payer: Managed Care, Other (non HMO) | Source: Home / Self Care

## 2016-04-23 ENCOUNTER — Emergency Department (HOSPITAL_BASED_OUTPATIENT_CLINIC_OR_DEPARTMENT_OTHER)
Admission: EM | Admit: 2016-04-23 | Discharge: 2016-04-24 | Disposition: A | Discharge status: Home / Self Care | Payer: Managed Care, Other (non HMO) | Attending: Emergency Medicine | Admitting: Emergency Medicine

## 2016-04-23 DIAGNOSIS — R51 Headache: Secondary | ICD-10-CM | POA: Insufficient documentation

## 2016-04-23 DIAGNOSIS — R42 Dizziness and giddiness: Secondary | ICD-10-CM

## 2016-04-23 DIAGNOSIS — Z5321 Procedure and treatment not carried out due to patient leaving prior to being seen by health care provider: Secondary | ICD-10-CM | POA: Insufficient documentation

## 2016-04-23 DIAGNOSIS — H60502 Unspecified acute noninfective otitis externa, left ear: Secondary | ICD-10-CM | POA: Insufficient documentation

## 2016-04-23 MED ORDER — NEOMYCIN-POLYMYXIN-HC 3.5-10000-1 OT SUSP: 5.0000 [drp] | OTIC | 0 refills | Status: AC

## 2016-04-23 NOTE — ED Triage Notes (Signed)
Pt states she is here for HA, light headedness, dry mouth and wax in ear

## 2016-04-23 NOTE — ED Notes (Signed)
Called pt to take to room, no answer.

## 2016-04-23 NOTE — ED Triage Notes (Signed)
Ear pain x 3 days. °

## 2016-04-23 NOTE — ED Provider Notes (Signed)
[facility] Provider Note   CSN: [id number] Arrival date & time: 04/23/16  2208 By signing my name below, I, Linus GalasMaharshi Patel, attest that this documentation has been prepared under the direction and in the presence of Melburn HakeNicole Siennah Barrasso, [place]. Electronically Signed: Linus GalasMaharshi Patel, ED Scribe. 04/23/16. 10:42 PM.  History   Chief Complaint Chief Complaint  Patient presents with  . Otalgia   The history is provided by the patient. No language interpreter was used.   HPI Comments: Ashley Thompson is a 25 y.o. female who presents to the Emergency Department complaining of left ear discomfort for the past 4 days. Pt states she was using a Q-tip in her left ear when she felt as if she pushed ear wax further in her ear. Since then, she has had ear discomfort. Pt denies ear discharge/swelling, loss of hearing, fevers, chills, facial swelling, sore throat, or any other symptoms at this time.   History reviewed. No pertinent past medical history.  There are no active problems to display for this patient.   History reviewed. No pertinent surgical history.  OB History    No data available       Home Medications    Prior to Admission medications   Medication Sig Start Date End Date Taking? Authorizing Provider  ferrous sulfate 325 (65 FE) MG tablet Take 1 tablet (325 mg total) by mouth daily. 10/26/15   Lona KettleAshley Laurel Meyer, PA-C  megestrol (MEGACE) 40 MG tablet Take 1 tablet (40 mg total) by mouth daily. 10/26/15   Lona KettleAshley Laurel Meyer, PA-C  neomycin-polymyxin-hydrocortisone (CORTISPORIN) 3.5-10000-1 otic suspension Place 5 drops into the left ear every 4 (four) hours. 04/23/16 04/28/16  Barrett HenleNicole Elizabeth Chany Woolworth, PA-C    Family History History reviewed. No pertinent family history.  Social History Social History  Substance Use Topics  . Smoking status: Never Smoker  . Smokeless tobacco: Never Used  . Alcohol use Yes     Allergies   Patient has no known allergies.   Review of  Systems Review of Systems  Constitutional: Negative for chills and fever.  HENT: Positive for ear pain. Negative for ear discharge and sore throat.    Physical Exam Updated Vital Signs BP 139/88   Pulse 86   Temp 98.1 F (36.7 C) (Oral)   Resp 18   Ht 5\' 4"  (1.626 m)   Wt 131.5 kg   LMP 04/20/2016   SpO2 98%   BMI 49.78 kg/m   Physical Exam  Constitutional: She is oriented to person, place, and time. She appears well-developed and well-nourished.  HENT:  Head: Normocephalic and atraumatic.  Right Ear: Hearing, tympanic membrane, external ear and ear canal normal. No drainage, swelling or tenderness. No mastoid tenderness.  Left Ear: Hearing, tympanic membrane and external ear normal. There is drainage, swelling and tenderness. No mastoid tenderness.  Mouth/Throat: Uvula is midline, oropharynx is clear and moist and mucous membranes are normal. No oropharyngeal exudate, posterior oropharyngeal edema, posterior oropharyngeal erythema or tonsillar abscesses. No tonsillar exudate.  Mild swelling and erythema noted to left ear canal with small amount of white drainage present in canal.   Eyes: Conjunctivae and EOM are normal. Right eye exhibits no discharge. Left eye exhibits no discharge. No scleral icterus.  Neck: Normal range of motion. Neck supple.  Cardiovascular: Normal rate and intact distal pulses.   Pulmonary/Chest: Effort normal.  Musculoskeletal: Normal range of motion.  Neurological: She is alert and oriented to person, place, and time.  Skin: Skin is warm and  dry.  Nursing note and vitals reviewed.  ED Treatments / Results  DIAGNOSTIC STUDIES: Oxygen Saturation is 98% on room air, normal by my interpretation.    COORDINATION OF CARE: 10:47 PM Discussed treatment plan with pt at bedside and pt agreed to plan.  Labs (all labs ordered are listed, but only abnormal results are displayed) Labs Reviewed - No data to display  EKG  EKG Interpretation None        Radiology No results found.  Procedures Procedures (including critical care time)  Medications Ordered in ED Medications - No data to display   Initial Impression / Assessment and Plan / ED Course  I have reviewed the triage vital signs and the nursing notes.  Pertinent labs & imaging results that were available during my care of the patient were reviewed by me and considered in my medical decision making (see chart for details).  Clinical Course     Pt presenting with otitis externa. No canal occlusion present on my initial evaluation. I was notified by nurse that she had irrigated pt's ear and removed cerumen impaction earlier. Pt afebrile in NAD. Exam non concerning for mastoiditis, cellulitis or malignant OE. Dc with topical antibiotics.  Advised PCP follow up in 2-3 days if no improvement with treatment or no complete resolution by 7 days. Earlier f-u if develop rash , allergic reaction to medication, or loss of hearing.   Final Clinical Impressions(s) / ED Diagnoses   Final diagnoses:  Acute otitis externa of left ear, unspecified type    New Prescriptions New Prescriptions   NEOMYCIN-POLYMYXIN-HYDROCORTISONE (CORTISPORIN) 3.5-10000-1 OTIC SUSPENSION    Place 5 drops into the left ear every 4 (four) hours.   I personally performed the services described in this documentation, which was scribed in my presence. The recorded information has been reviewed and is accurate.    Satira Sarkicole Elizabeth [place] City[place]au, [place]-C 04/23/16 2306    Loren Raceravid Yelverton, MD 04/26/16 [phone]

## 2016-04-23 NOTE — Discharge Instructions (Signed)
Take your medications as prescribed. I also recommend taking Tylenol and/or ibuprofen as prescribed over-the-counter Visine for pain relief. I recommend refraining from placing any Q-tips or other objects in her ear to prevent cerumen impaction. Please follow up with a primary care provider from the Resource Guide provided below in one week if her symptoms did not improve. Please return to the Emergency Department if symptoms worsen or new onset of fever, headache, neck stiffness, facial/6 swelling, swelling around her ear, ear drainage, loss of hearing.

## 2016-04-23 NOTE — ED Notes (Signed)
Pt called 3 times to take to room, no answer

## 2016-08-21 IMAGING — CT CT HEAD W/O CM
1 series · 16 of 30 positions shown, 20 images · non-contrast
Comparison: None.

CLINICAL DATA: Face feels stiff.  Right face tingling.

EXAM:
CT HEAD WITHOUT CONTRAST
TECHNIQUE: Contiguous axial images were obtained from the base of the skull
through the vertex without intravenous contrast.

[Series 2: head wo · axial · 0.43mm/px · z∈[-171,-36]mm · 16 of 30 slices shown, 20 images]
[im 2/30  brain]
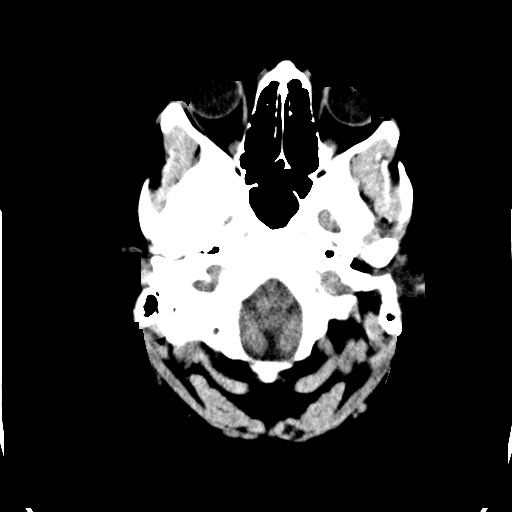
[im 2/30  bone]
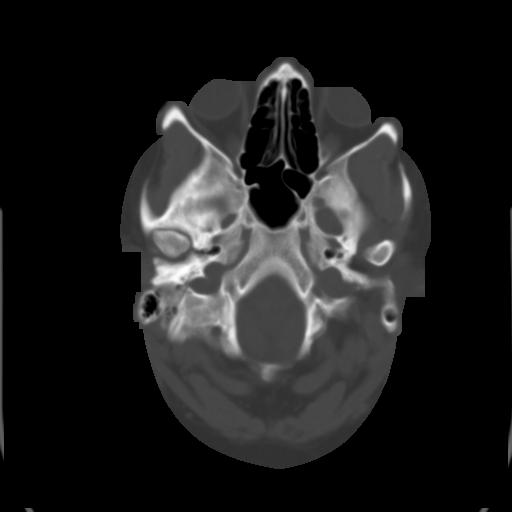
[im 4/30  brain]
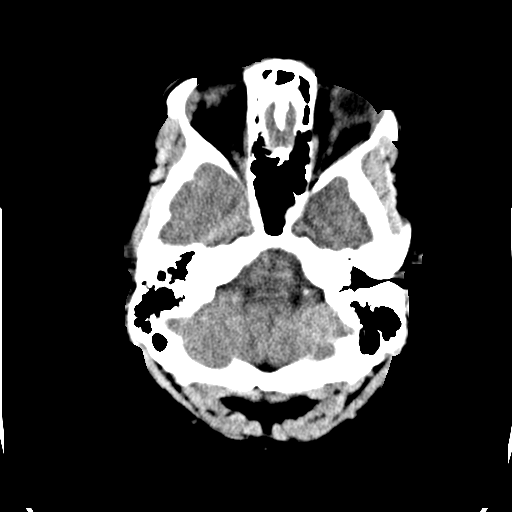
[im 6/30  brain]
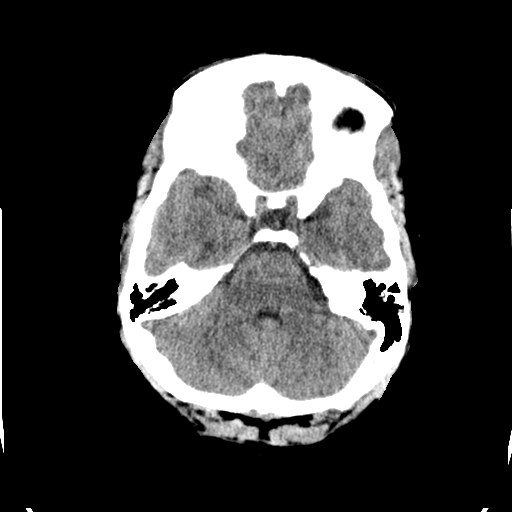
[im 8/30  brain]
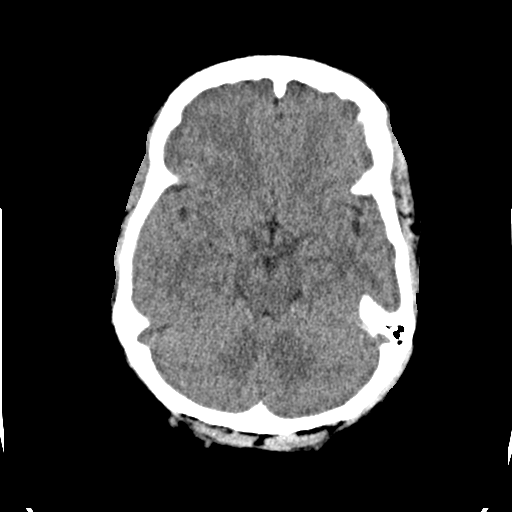
[im 9/30  brain]
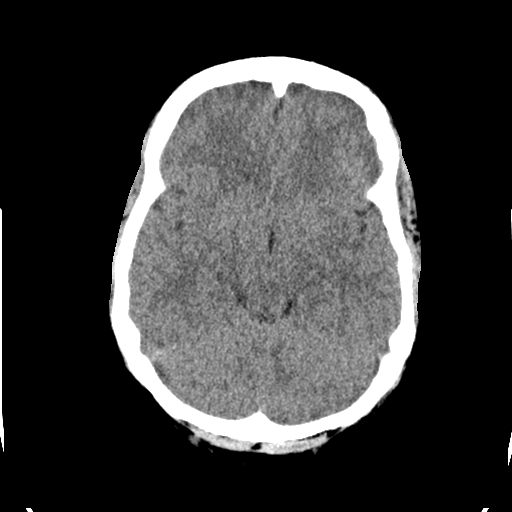
[im 9/30  bone]
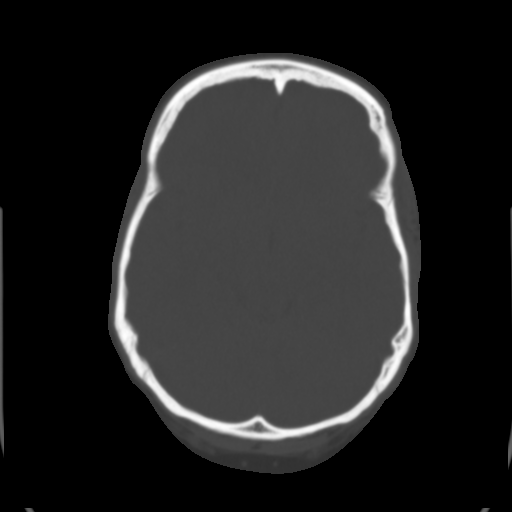
[im 11/30  brain]
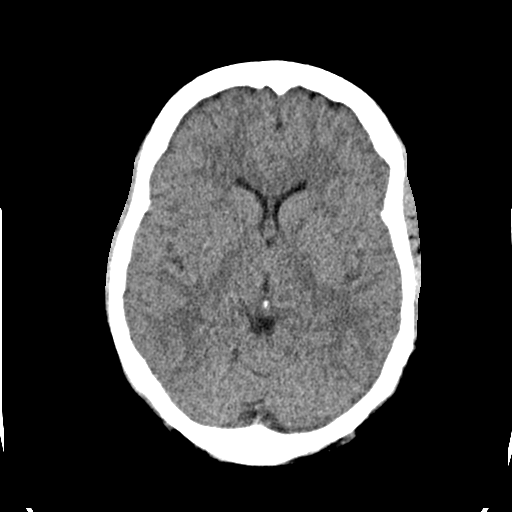
[im 13/30  brain]
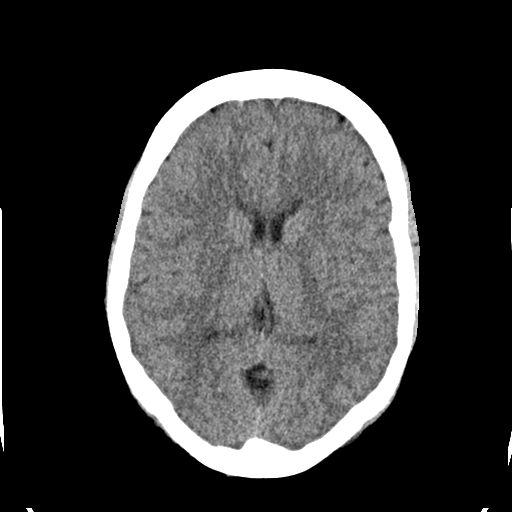
[im 15/30  brain]
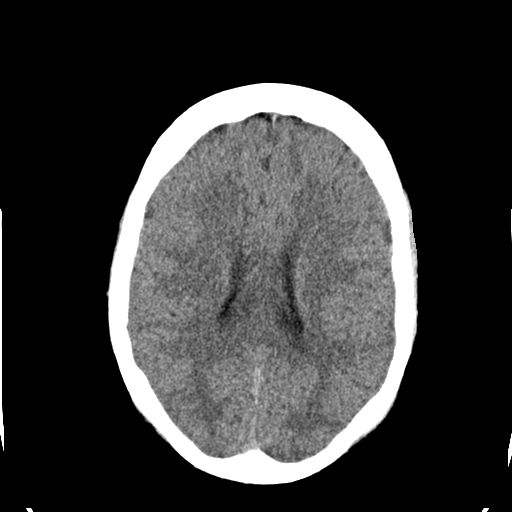
[im 16/30  brain]
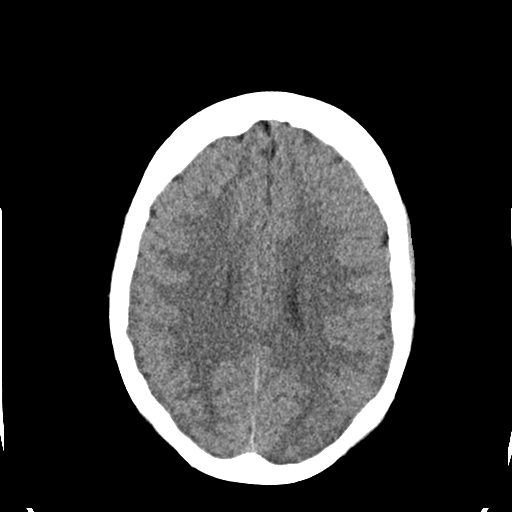
[im 16/30  bone]
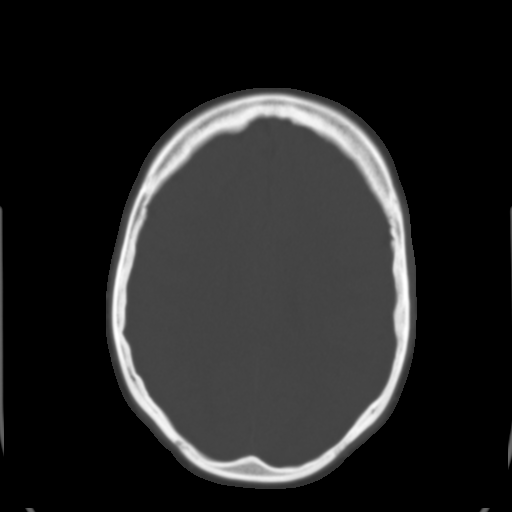
[im 18/30  brain]
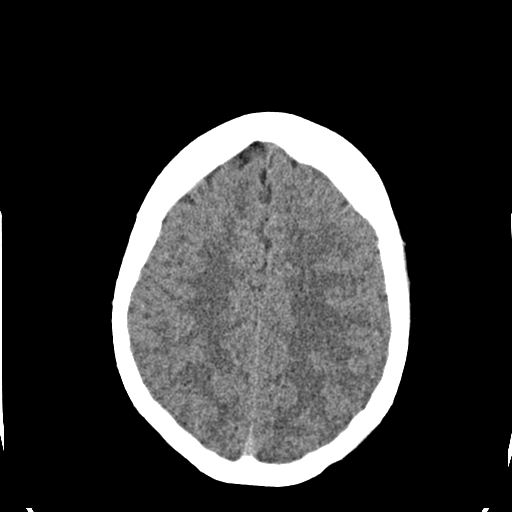
[im 20/30  brain]
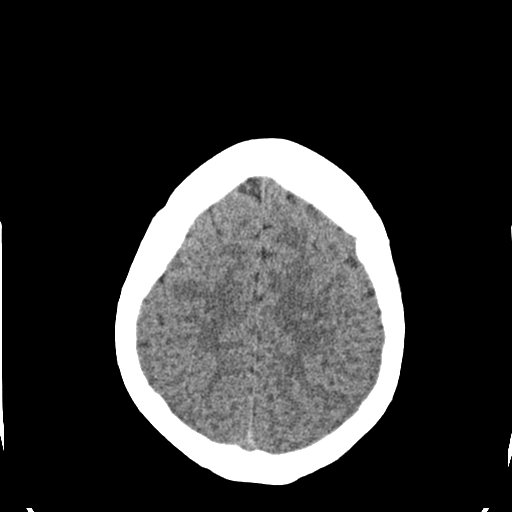
[im 22/30  brain]
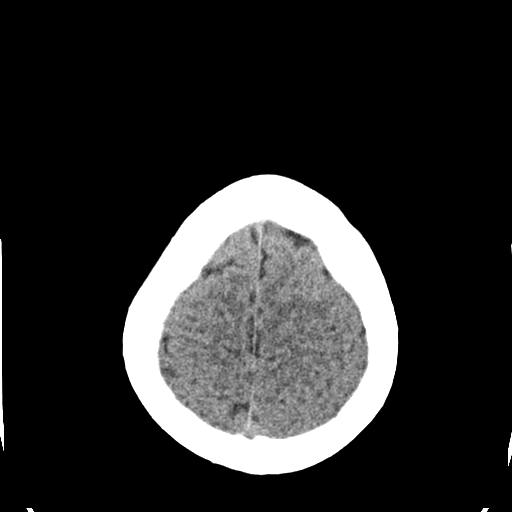
[im 23/30  brain]
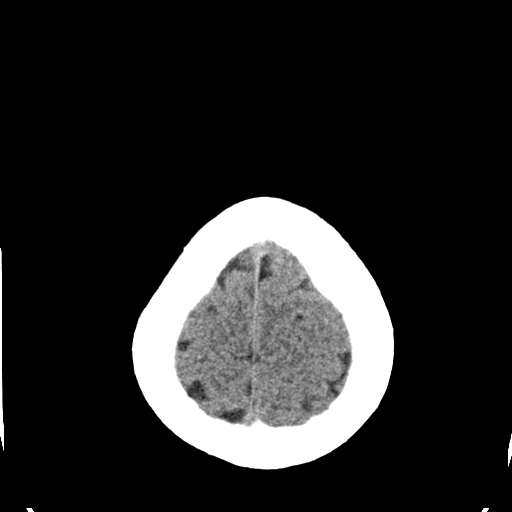
[im 23/30  bone]
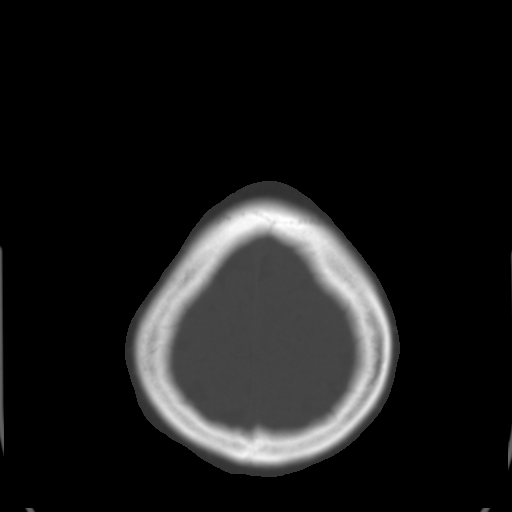
[im 25/30  brain]
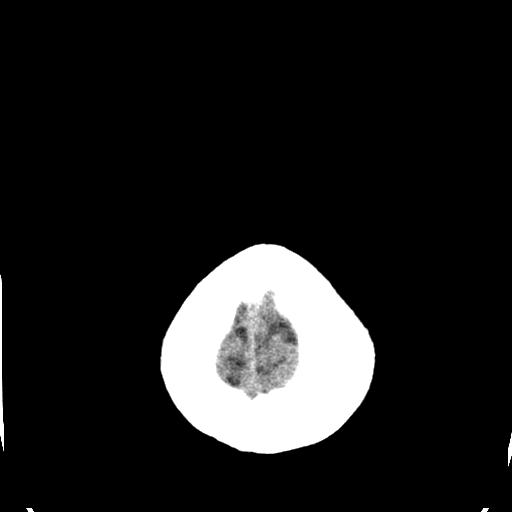
[im 27/30  brain]
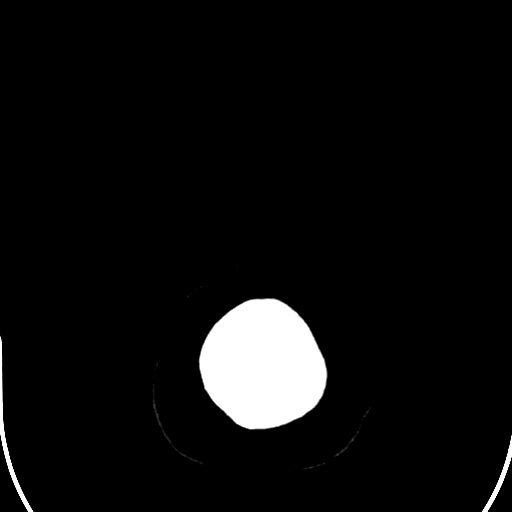
[im 29/30  brain]
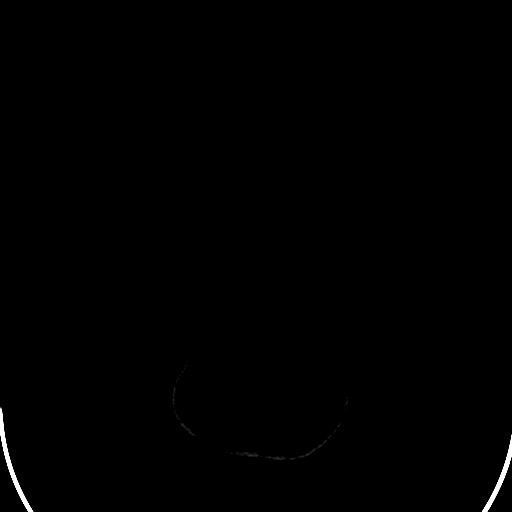

[16 of 30 positions shown; findings below may reference images not displayed]

FINDINGS: Skull and Sinuses:Negative for fracture or destructive process.
Smooth asymmetric enlargement of the left foramen ovale which is
likely developmental. This finding is contralateral to the patient's
face symptoms.

Visualized orbits: Negative.

Brain: Normal. No evidence of acute infarction, hemorrhage,
hydrocephalus, or mass lesion/mass effect.
IMPRESSION: Negative study.  Normal appearance of the brain.

## 2016-10-08 IMAGING — CR DG CHEST 2V
2 series · 2 of 2 positions shown · non-contrast
Comparison: None.

CLINICAL DATA: Vaginal bleeding with low hemoglobin. Dizziness
today. Sore throat. Difficulty swallowing.

EXAM:
CHEST  2 VIEW

[w chest pa]
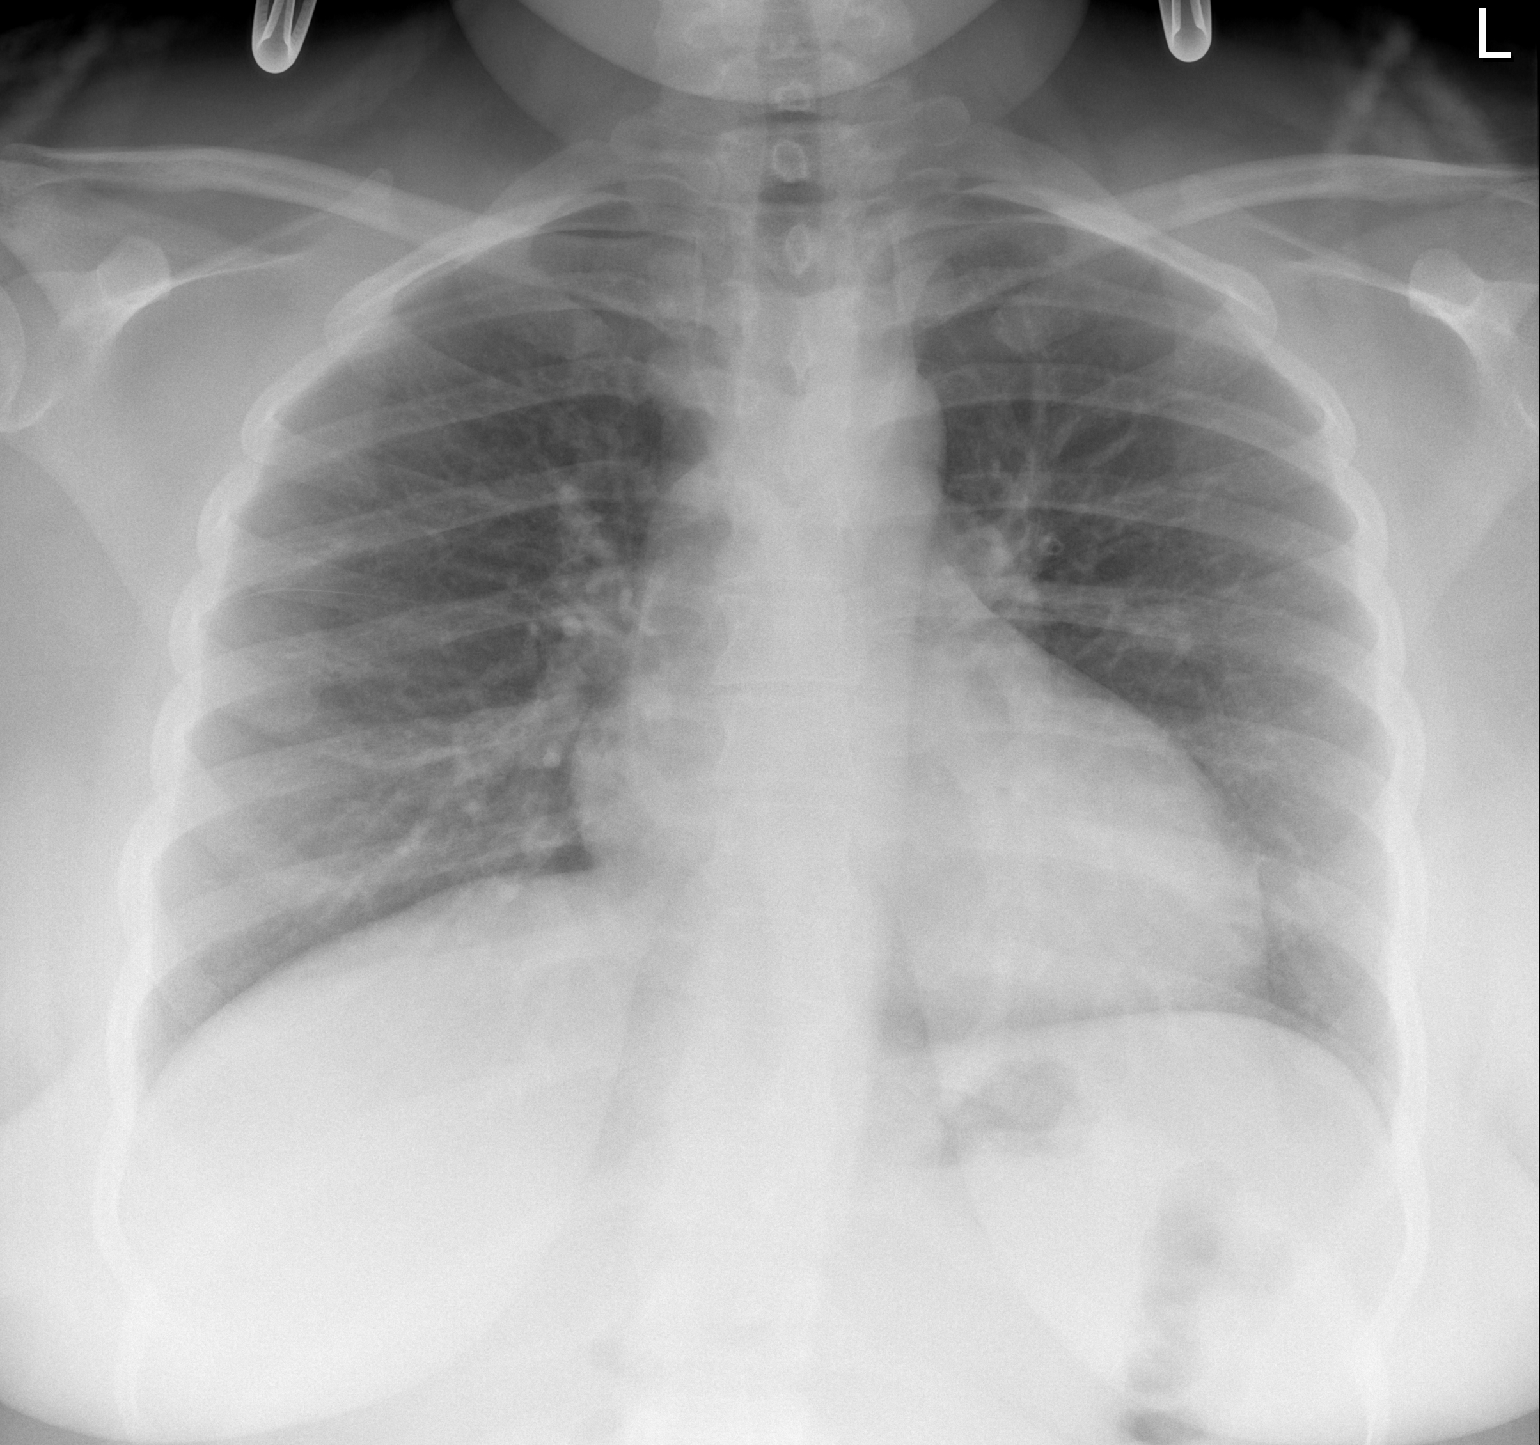

[w chest lat]
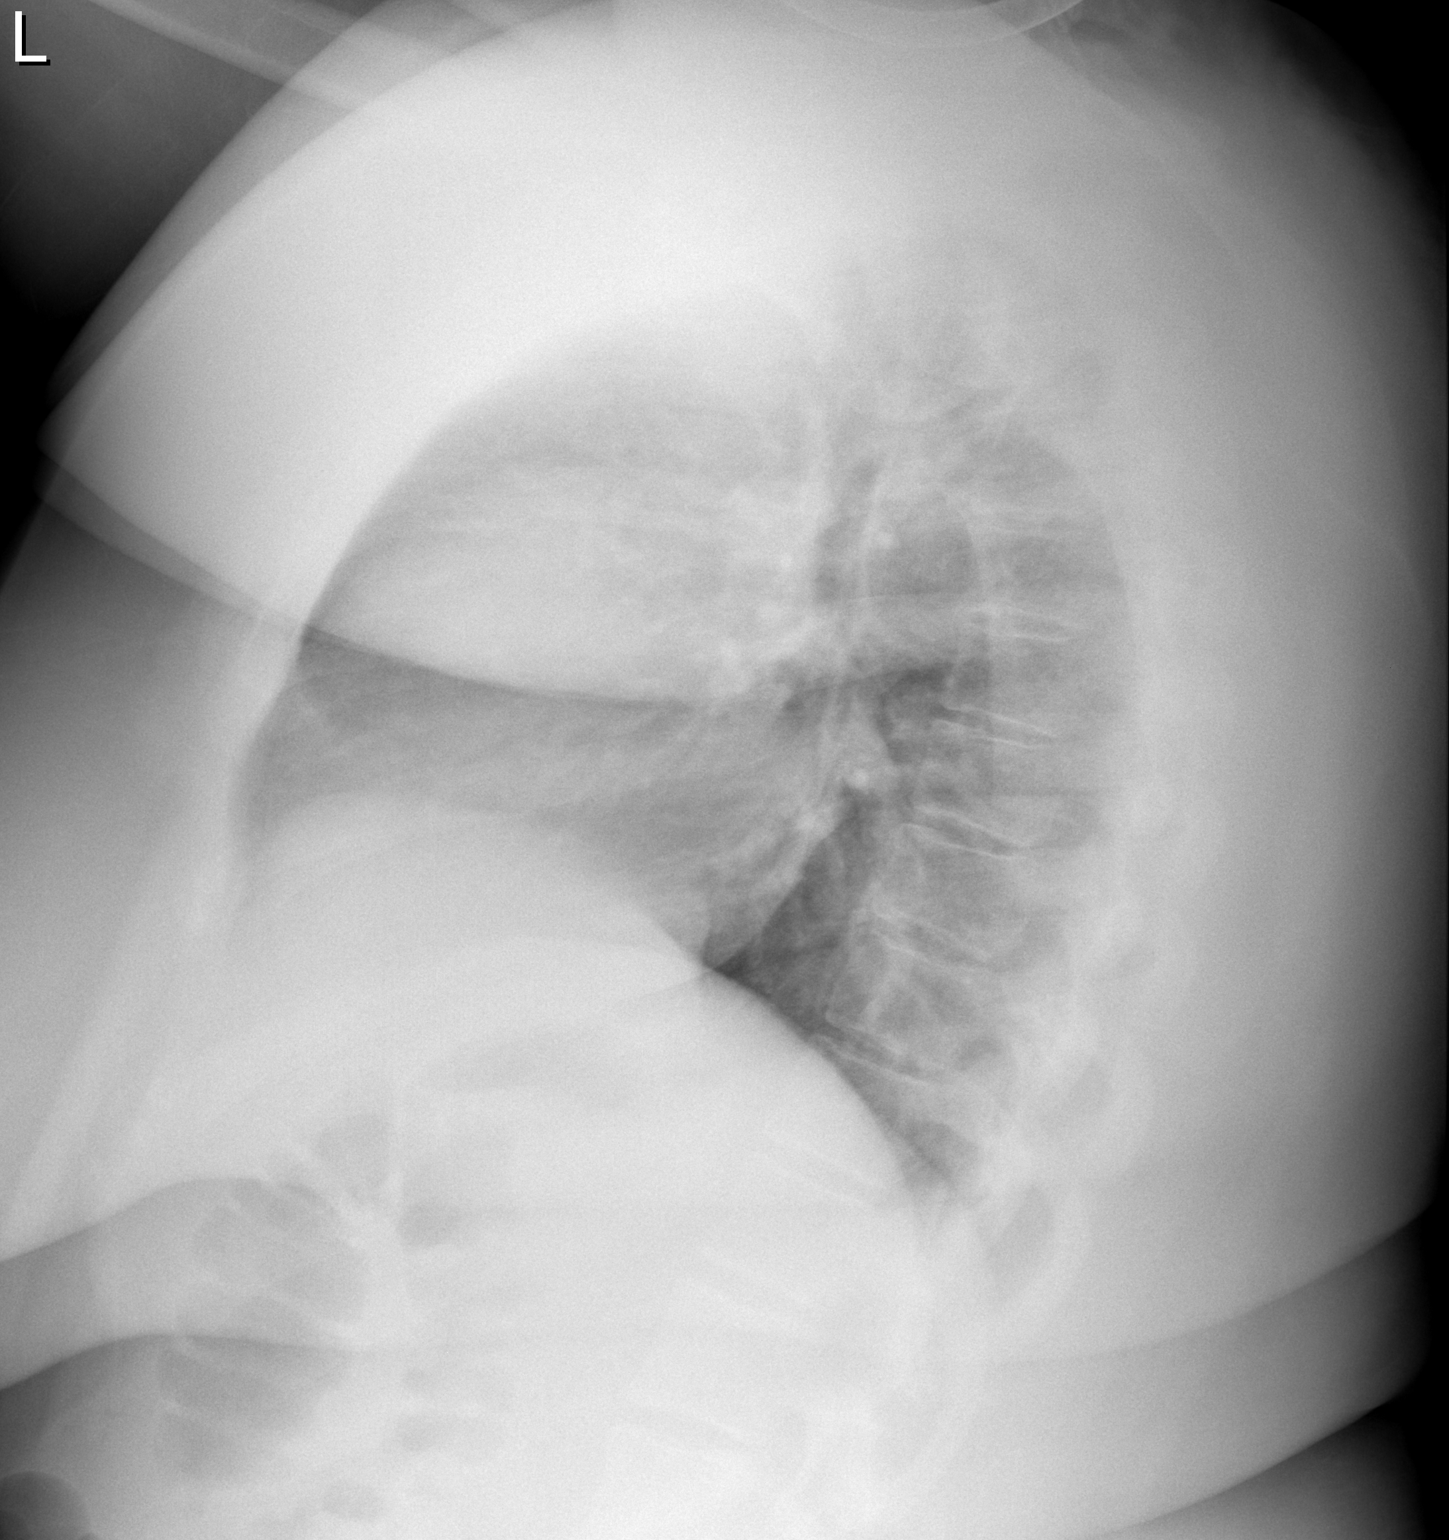

[2 of 2 positions shown; findings below may reference images not displayed]

FINDINGS: The heart size and mediastinal contours are within normal limits.
Both lungs are clear. The visualized skeletal structures are
unremarkable.
IMPRESSION: No active cardiopulmonary disease.

## 2016-10-28 ENCOUNTER — Encounter (HOSPITAL_BASED_OUTPATIENT_CLINIC_OR_DEPARTMENT_OTHER): Payer: Self-pay | Admitting: Emergency Medicine

## 2016-10-28 ENCOUNTER — Emergency Department (HOSPITAL_BASED_OUTPATIENT_CLINIC_OR_DEPARTMENT_OTHER)
Admission: EM | Admit: 2016-10-28 | Discharge: 2016-10-28 | Disposition: A | Discharge status: Home / Self Care | Payer: Self-pay | Attending: Emergency Medicine | Admitting: Emergency Medicine

## 2016-10-28 DIAGNOSIS — H1032 Unspecified acute conjunctivitis, left eye: Secondary | ICD-10-CM

## 2016-10-28 DIAGNOSIS — H109 Unspecified conjunctivitis: Secondary | ICD-10-CM | POA: Insufficient documentation

## 2016-10-28 MED ORDER — POLYMYXIN B-TRIMETHOPRIM 10000-0.1 UNIT/ML-% OP SOLN: 2.0000 [drp] | OPHTHALMIC | 0 refills | Status: AC

## 2016-10-28 MED ORDER — FLUORESCEIN SODIUM 0.6 MG OP STRP
1.0000 | ORAL_STRIP | Freq: Once | OPHTHALMIC | Status: AC
  Administered 2016-10-28: 1 via OPHTHALMIC
  Filled 2016-10-28: qty 1

## 2016-10-28 MED FILL — POLYMYXIN B/TMP EYE DROPS: 10000-0.1 | 17 days supply | Qty: 10 | Fill #0

## 2016-10-28 NOTE — ED Triage Notes (Signed)
L eye pain and swelling x 3 days. Pt denies vision problems.

## 2016-10-28 NOTE — ED Provider Notes (Signed)
[facility]   CSN: [id number] Arrival date & time: 10/28/16  1133     History   Chief Complaint Chief Complaint  Patient presents with  . Eye Problem    HPI Ashley Thompson is a 26 y.o. female who is previously healthy who presents with a three-day history of left eye redness, drainage, and irritation. Patient states she has had tearing of her left eye. She has also had crusting in her eye in the mornings. She states her symptoms seem to have begun around the time she put on false eyelashes. She has since removed the false eyelashes. Patient has not used any medications at home for her symptoms. She denies vision changes. Patient reports her younger child had one day of fever and unilateral eye redness last week, however this resolved without treatment. Patient denies any fevers, cough, nasal congestion, sore throat. She does not wear glasses or contact lenses.  HPI  History reviewed. No pertinent past medical history.  There are no active problems to display for this patient.   History reviewed. No pertinent surgical history.  OB History    No data available       Home Medications    Prior to Admission medications   Medication Sig Start Date End Date Taking? Authorizing Provider  trimethoprim-polymyxin b (POLYTRIM) ophthalmic solution Place 2 drops into the left eye every 4 (four) hours. 10/28/16 11/04/16  Emi Holes, PA-C    Family History No family history on file.  Social History Social History  Substance Use Topics  . Smoking status: Never Smoker  . Smokeless tobacco: Never Used  . Alcohol use Yes     Allergies   Patient has no known allergies.   Review of Systems Review of Systems  Constitutional: Negative for fever.  HENT: Negative for congestion and sore throat.   Eyes: Positive for pain (described as irritation), discharge and redness. Negative for photophobia and visual disturbance.  Respiratory: Negative for cough.        Physical Exam Updated Vital Signs BP (!) 123/99 (BP Location: Left Arm)   Pulse 90   Temp 98.6 F (37 C) (Oral)   Resp 18   Ht 5\' 4"  (1.626 m)   Wt 127 kg (280 lb)   LMP 09/12/2016   SpO2 99%   BMI 48.06 kg/m   Physical Exam  Constitutional: She appears well-developed and well-nourished. No distress.  HENT:  Head: Normocephalic and atraumatic.  Mouth/Throat: Oropharynx is clear and moist. No oropharyngeal exudate.  Eyes: EOM are normal. Pupils are equal, round, and reactive to light. Lids are everted and swept, no foreign bodies found. Right eye exhibits no discharge. Left eye exhibits discharge (clear/yellow). Left conjunctiva is injected. Left conjunctiva has no hemorrhage. No scleral icterus.  On fluorescein stain, no abrasions or herpetic lesions noted. No pain with EOMs or consensual photophobia. No periorbital tenderness or erythema; mild edema to lids does not extend to periorbital area.    Visual Acuity  Right Eye Distance: 25 Left Eye Distance: 40 Bilateral Distance: 25  Right Eye Near: R Near: 20 Left Eye Near:  L Near: 20 Bilateral Near:  20  Neck: Normal range of motion. Neck supple. No thyromegaly present.  Cardiovascular: Normal rate, regular rhythm, normal heart sounds and intact distal pulses.  Exam reveals no gallop and no friction rub.   No murmur heard. Pulmonary/Chest: Effort normal and breath sounds normal. No stridor. No respiratory distress. She has no wheezes. She has no  rales.  Musculoskeletal: She exhibits no edema.  Lymphadenopathy:    She has no cervical adenopathy.  Neurological: She is alert. Coordination normal.  Skin: Skin is warm and dry. No rash noted. She is not diaphoretic. No pallor.  Psychiatric: She has a normal mood and affect.  Nursing note and vitals reviewed.    ED Treatments / Results  Labs (all labs ordered are listed, but only abnormal results are displayed) Labs Reviewed - No data to display  EKG  EKG  Interpretation None       Radiology No results found.  Procedures Procedures (including critical care time)  Medications Ordered in ED Medications  fluorescein ophthalmic strip 1 strip (1 strip Left Eye Given 10/28/16 1205)     Initial Impression / Assessment and Plan / ED Course  I have reviewed the triage vital signs and the nursing notes.  Pertinent labs & imaging results that were available during my care of the patient were reviewed by me and considered in my medical decision making (see chart for details).     Patient presentation consistent with conjunctivitis.  No evidence of corneal abrasions, entrapment, consensual photophobia, or herpes keratitis.  Presentation not concerning for iritis, or corneal abrasions.  Pt discharged with Polytrim.  Personal hygiene and frequent handwashing discussed.  Patient advised to follow up with ophthalmologist within 48 hours considering visual acuity discrepancy. Return precautions discussed.  Patient verbalizes understanding and is agreeable with discharge. I discussed patient case with Dr. Particia NearingHaviland who agrees with plan.    Final Clinical Impressions(s) / ED Diagnoses   Final diagnoses:  Acute bacterial conjunctivitis of left eye    New Prescriptions Discharge Medication List as of 10/28/2016 12:47 PM    START taking these medications   Details  trimethoprim-polymyxin b (POLYTRIM) ophthalmic solution Place 2 drops into the left eye every 4 (four) hours., Starting Tue 10/28/2016, Until Tue 11/04/2016, Print         Lianna Sitzmann, HuntleyAlexandra M, PA-C 10/28/16 1335    Jacalyn LefevreHaviland, Julie, MD 10/28/16 1359

## 2016-10-28 NOTE — Discharge Instructions (Signed)
Medications: Polytrim  Treatment: Use 2 drops of Polytrim in your left eye every 4 hours for 7 days. Use a warm compress 2-3 times daily. Wash your hands regularly and try not to touch your eye.  Follow-up: Please follow-up with the ophthalmologist, Dr. Dione BoozeGroat, within 48 hours for recheck and follow-up of today's visit. Please return to emergency department if you develop any new or worsening symptoms, including worsening vision changes, increasing swelling, pain with moving your eye, or any other concerning symptoms.

## 2017-02-07 ENCOUNTER — Emergency Department (HOSPITAL_BASED_OUTPATIENT_CLINIC_OR_DEPARTMENT_OTHER)
Admission: EM | Admit: 2017-02-07 | Discharge: 2017-02-07 | Disposition: A | Discharge status: Home / Self Care | Payer: Self-pay | Attending: Emergency Medicine | Admitting: Emergency Medicine

## 2017-02-07 ENCOUNTER — Encounter (HOSPITAL_BASED_OUTPATIENT_CLINIC_OR_DEPARTMENT_OTHER): Payer: Self-pay | Admitting: *Deleted

## 2017-02-07 DIAGNOSIS — R1084 Generalized abdominal pain: Secondary | ICD-10-CM | POA: Insufficient documentation

## 2017-02-07 DIAGNOSIS — R110 Nausea: Secondary | ICD-10-CM | POA: Insufficient documentation

## 2017-02-07 LAB — COMPREHENSIVE METABOLIC PANEL
ALK PHOS: 72 U/L (ref 38–126)
ALT: 16 U/L (ref 14–54)
AST: 22 U/L (ref 15–41)
Albumin: 4 g/dL (ref 3.5–5.0)
Anion gap: 7 (ref 5–15)
BILIRUBIN TOTAL: 0.4 mg/dL (ref 0.3–1.2)
BUN: 15 mg/dL (ref 6–20)
CALCIUM: 9.3 mg/dL (ref 8.9–10.3)
CO2: 24 mmol/L (ref 22–32)
CREATININE: 0.63 mg/dL (ref 0.44–1.00)
Chloride: 105 mmol/L (ref 101–111)
GFR calc Af Amer: >60 mL/min (ref >60)
Glucose, Bld: 102 mg/dL — ABNORMAL HIGH (ref 65–99)
Potassium: 3.9 mmol/L (ref 3.5–5.1)
Sodium: 136 mmol/L (ref 135–145)
TOTAL PROTEIN: 8.5 g/dL — AB (ref 6.5–8.1)

## 2017-02-07 LAB — CBC
HCT: 35.7 % — ABNORMAL LOW (ref 36.0–46.0)
Hemoglobin: 11 g/dL — ABNORMAL LOW (ref 12.0–15.0)
MCH: 19.3 pg — ABNORMAL LOW (ref 26.0–34.0)
MCHC: 30.8 g/dL (ref 30.0–36.0)
MCV: 62.5 fL — AB (ref 78.0–100.0)
PLATELETS: 331 10*3/uL (ref 150–400)
RBC: 5.71 MIL/uL — ABNORMAL HIGH (ref 3.87–5.11)
RDW: 19.3 % — AB (ref 11.5–15.5)
WBC: 6.8 10*3/uL (ref 4.0–10.5)

## 2017-02-07 LAB — URINALYSIS, ROUTINE W REFLEX MICROSCOPIC
Bilirubin Urine: NEGATIVE
GLUCOSE, UA: NEGATIVE mg/dL
Hgb urine dipstick: NEGATIVE
KETONES UR: NEGATIVE mg/dL
LEUKOCYTES UA: NEGATIVE
NITRITE: NEGATIVE
PROTEIN: NEGATIVE mg/dL
Specific Gravity, Urine: >1.03 — ABNORMAL HIGH (ref 1.005–1.030)
pH: 6 (ref 5.0–8.0)

## 2017-02-07 LAB — PREGNANCY, URINE: Preg Test, Ur: NEGATIVE

## 2017-02-07 LAB — LIPASE, BLOOD: Lipase: 30 U/L (ref 11–51)

## 2017-02-07 MED ORDER — ONDANSETRON HCL 4 MG/2ML IJ SOLN: 4.0000 mg | Freq: Once | INTRAMUSCULAR | Status: DC

## 2017-02-07 MED ORDER — SODIUM CHLORIDE 0.9 % IV BOLUS (SEPSIS)
1000.0000 mL | Freq: Once | INTRAVENOUS | Status: AC
  Administered 2017-02-07: 1000 mL via INTRAVENOUS

## 2017-02-07 NOTE — ED Provider Notes (Signed)
[facility]   CSN: [id number] Arrival date & time: 02/07/17  0825     History   Chief Complaint Chief Complaint  Patient presents with  . Abdominal Pain    HPI Ashley Thompson is a 26 y.o. female.  HPI  Patient presents to ED for evaluation of one hour history of epigastric abdominal pain that began after she woke up this morning. At that time, she describes the pain as "a really big cramp, 10/10 on the pain scale." She then had a bowel movement and took a hot shower. She reports complete resolution of her symptoms since then and after being here in the ED. She has felt intermittently nauseous over the past few days. She denies any diarrhea, constipation, blood in stool, urinary symptoms, fever, chills, abdominal surgery, vaginal discharge, abnormal vaginal bleeding. She denies any alcohol, tobacco or marijuana use.  History reviewed. No pertinent past medical history.  There are no active problems to display for this patient.   History reviewed. No pertinent surgical history.  OB History    No data available       Home Medications    Prior to Admission medications   Not on File    Family History No family history on file.  Social History Social History  Substance Use Topics  . Smoking status: Never Smoker  . Smokeless tobacco: Never Used  . Alcohol use Yes     Comment: rarely     Allergies   Patient has no known allergies.   Review of Systems Review of Systems  Constitutional: Negative for appetite change, chills and fever.  HENT: Negative for ear pain, rhinorrhea, sneezing and sore throat.   Eyes: Negative for photophobia and visual disturbance.  Respiratory: Negative for cough, chest tightness, shortness of breath and wheezing.   Cardiovascular: Negative for chest pain and palpitations.  Gastrointestinal: Positive for abdominal pain and nausea. Negative for blood in stool, constipation, diarrhea and vomiting.  Genitourinary:  Negative for dysuria, hematuria and urgency.  Musculoskeletal: Negative for myalgias.  Skin: Negative for rash.  Neurological: Negative for dizziness, weakness and light-headedness.     Physical Exam Updated Vital Signs BP 119/87 (BP Location: Right Arm)   Pulse 70   Temp 98 F (36.7 C) (Oral)   Resp 18   Ht  (1.626 m)   Wt 133.8 kg (295 lb)   LMP 01/24/2017 (Approximate)   SpO2 100%   BMI 50.64 kg/m   Physical Exam  Constitutional: She appears well-developed and well-nourished. No distress.  Nontoxic appearing and in no acute distress.  HENT:  Head: Normocephalic and atraumatic.  Nose: Nose normal.  Eyes: Conjunctivae and EOM are normal. Left eye exhibits no discharge. No scleral icterus.  Neck: Normal range of motion. Neck supple.  Cardiovascular: Normal rate, regular rhythm, normal heart sounds and intact distal pulses.  Exam reveals no gallop and no friction rub.   No murmur heard. Pulmonary/Chest: Effort normal and breath sounds normal. No respiratory distress.  Abdominal: Soft. Bowel sounds are normal. She exhibits no distension. There is no tenderness. There is no guarding.  Denies any abdominal pain currently.  Musculoskeletal: Normal range of motion. She exhibits no edema.  Neurological: She is alert. She exhibits normal muscle tone. Coordination normal.  Skin: Skin is warm and dry. No rash noted.  Psychiatric: She has a normal mood and affect.  Nursing note and vitals reviewed.    ED Treatments / Results  Labs (all labs ordered are  listed, but only abnormal results are displayed) Labs Reviewed  COMPREHENSIVE METABOLIC PANEL - Abnormal; Notable for the following:       Result Value   Glucose, Bld 102 (*)    Total Protein 8.5 (*)    All other components within normal limits  CBC - Abnormal; Notable for the following:    RBC 5.71 (*)    Hemoglobin 11.0 (*)    HCT 35.7 (*)    MCV 62.5 (*)    MCH 19.3 (*)    RDW 19.3 (*)    All other components  within normal limits  URINALYSIS, ROUTINE W REFLEX MICROSCOPIC - Abnormal; Notable for the following:    APPearance HAZY (*)    Specific Gravity, Urine >1.030 (*)    All other components within normal limits  LIPASE, BLOOD  PREGNANCY, URINE    EKG  EKG Interpretation None       Radiology No results found.  Procedures Procedures (including critical care time)  Medications Ordered in ED Medications  ondansetron (ZOFRAN) injection 4 mg (0 mg Intravenous Hold 02/07/17 0917)  sodium chloride 0.9 % bolus 1,000 mL (1,000 mLs Intravenous New Bag/Given 02/07/17 0916)     Initial Impression / Assessment and Plan / ED Course  I have reviewed the triage vital signs and the nursing notes.  Pertinent labs & imaging results that were available during my care of the patient were reviewed by me and considered in my medical decision making (see chart for details).     Patient presents to ED for evaluation of abdominal pain after waking up this morning. States that the pain felt like a cramp, but pain has since resolved since bowel movement, hot shower and arrival in the ED. On my physical examination she is nontoxic appearing and in no acute distress. She has no current abdominal pain with palpation, and she states that initially the pain was in the epigastric area. She is afebrile with no history of fever. Other vital signs are stable. Lab work including urinalysis, urine pregnancy negative. CBC, CMP, lipase unremarkable. Patient given fluids. I doubt acute intra-abdominal surgical process being the cause of her abdominal pain. I suspect constipation which patient states she does experience intermittently. Patient appears stable for discharge at this time. Strict return precautions given.  Final Clinical Impressions(s) / ED Diagnoses   Final diagnoses:  Generalized abdominal pain    New Prescriptions New Prescriptions   No medications on file     Dietrich Pates, PA-C 02/07/17 1014      Charlynne Pander, MD 02/08/17 [phone]

## 2017-02-07 NOTE — Discharge Instructions (Signed)
Please read attached information regarding your condition. Return to ED for worsening, severe abdominal pain, vomiting, lightheadedness, fever.

## 2017-02-07 NOTE — ED Triage Notes (Signed)
Pt reports waking with abd pain this morning that intermittently radiates to epigastric area. Reports mild nausea. Denies fever, v/d, urinary symptoms.

## 2020-08-24 ENCOUNTER — Emergency Department (INDEPENDENT_AMBULATORY_CARE_PROVIDER_SITE_OTHER)
Admission: EM | Admit: 2020-08-24 | Discharge: 2020-08-24 | Disposition: A | Discharge status: Home / Self Care | Payer: Medicaid Other | Source: Home / Self Care

## 2020-08-24 DIAGNOSIS — H9312 Tinnitus, left ear: Secondary | ICD-10-CM

## 2020-08-24 DIAGNOSIS — R519 Headache, unspecified: Secondary | ICD-10-CM

## 2020-08-24 DIAGNOSIS — H66002 Acute suppurative otitis media without spontaneous rupture of ear drum, left ear: Secondary | ICD-10-CM

## 2020-08-24 DIAGNOSIS — H6122 Impacted cerumen, left ear: Secondary | ICD-10-CM

## 2020-08-24 LAB — POCT FASTING CBG KUC MANUAL ENTRY: POCT Glucose (KUC): 119 mg/dL — AB (ref 70–99)

## 2020-08-24 MED ORDER — LEVOCETIRIZINE DIHYDROCHLORIDE 5 MG PO TABS: 5.0000 mg | ORAL_TABLET | Freq: Every evening | ORAL | 0 refills | Status: AC

## 2020-08-24 MED ORDER — AMOXICILLIN 875 MG PO TABS: 875.0000 mg | ORAL_TABLET | Freq: Two times a day (BID) | ORAL | 0 refills | Status: AC

## 2020-08-24 MED ORDER — ACETAMINOPHEN 325 MG PO TABS
650.0000 mg | ORAL_TABLET | Freq: Four times a day (QID) | ORAL | Status: DC | PRN
  Administered 2020-08-24: 650 mg via ORAL

## 2020-08-24 MED ORDER — FLUTICASONE PROPIONATE 50 MCG/ACT NA SUSP: 2.0000 | Freq: Every day | NASAL | 12 refills | Status: AC

## 2020-08-24 NOTE — ED Provider Notes (Signed)
[facility]    CSN: [id number] Arrival date & time: 08/24/20  1431      History   Chief Complaint Chief Complaint  Patient presents with  . Headache    X4 days    HPI Ashley Thompson is a 30 y.o. female.   HPI  Headache x 4 days. Took ibuprofen yesterday relief for a few hours and HA returned. Endorse nasal congestion and nasal drainage intermittently. She doesn't take medication for allergy symptoms.  Concern HA was related to blood sugar or blood pressure. No history of hypertension and diabetes. No visual changes present, dizziness, or weakness.  History reviewed. No pertinent past medical history.  There are no problems to display for this patient.   History reviewed. No pertinent surgical history.  OB History   No obstetric history on file.      Home Medications    Prior to Admission medications   Not on File    Family History Family History  Problem Relation Age of Onset  . Healthy Mother   . Diabetes Father     Social History Social History   Tobacco Use  . Smoking status: Never Smoker  . Smokeless tobacco: Never Used  Substance Use Topics  . Alcohol use: Yes    Comment: rarely  . Drug use: No     Allergies   Patient has no known allergies.   Review of Systems Review of Systems Pertinent negatives listed in HPI  Physical Exam Triage Vital Signs ED Triage Vitals  Enc Vitals Group     BP 08/24/20 1520 (!) 145/79     Pulse Rate 08/24/20 1520 94     Resp 08/24/20 1520 16     Temp 08/24/20 1520 98.7 F (37.1 C)     Temp Source 08/24/20 1520 Oral     SpO2 08/24/20 1520 99 %     Weight --      Height --      Head Circumference --      Peak Flow --      Pain Score 08/24/20 1518 8     Pain Loc --      Pain Edu? --      Excl. in GC? --    No data found.  Updated Vital Signs BP (!) 145/79 (BP Location: Right Arm)   Pulse 94   Temp 98.7 F (37.1 C) (Oral)   Resp 16   LMP  (Exact Date)   SpO2 99%   Visual  Acuity Right Eye Distance:   Left Eye Distance:   Bilateral Distance:    Right Eye Near:   Left Eye Near:    Bilateral Near:     Physical Exam  General Appearance:    Alert, cooperative, no distress  HENT:   Normocephalic, B/L ears normal cerumen impaction (clear with irrigation_ left TM erythematous and buldgingTM , nares mucosal edema with congestion, rhinorrhea, oropharynx normal  Eyes:    PERRL, conjunctiva/corneas clear, EOM's intact       Lungs:     Clear to auscultation bilaterally, respirations unlabored  Heart:    Regular rate and rhythm  Neurologic:   Awake, alert, oriented x 3. No apparent focal neurological           defect.      UC Treatments / Results  Labs (all labs ordered are listed, but only abnormal results are displayed) Labs Reviewed  POCT FASTING CBG [facility] MANUAL ENTRY - Abnormal; Notable for the following components:  Result Value   POCT Glucose ([facility]) 119 (*)    All other components within normal limits    EKG   Radiology No results found.  Procedures Procedures (including critical care time)  Medications Ordered in UC Medications  acetaminophen (TYLENOL) tablet 650 mg (650 mg Oral Given 08/24/20 1528)    Initial Impression / Assessment and Plan / UC Course  I have reviewed the triage vital signs and the nursing notes.  Pertinent labs & imaging results that were available during my care of the patient were reviewed by me and considered in my medical decision making (see chart for details).     Left ear cerumen removal tolerated. Improved hearing with relief of pressure. Treating for acute otitis left  with Amoxicillin. Patient concerned about glucose, non fasting POC 119 unremarkable, continue further work-up and monitoring with PCP. Allergic rhinitis  xyzal and flonase. PCP follow-up as needed. Final Clinical Impressions(s) / UC Diagnoses   Final diagnoses:  Nonintractable headache, unspecified chronicity pattern, unspecified headache type   Left ear impacted cerumen  Tinnitus, left   Discharge Instructions   None    ED Prescriptions    Medication Sig Dispense Auth. Provider   levocetirizine (XYZAL) 5 MG tablet Take 1 tablet (5 mg total) by mouth every evening. 30 tablet Bing Neighbors, FNP   fluticasone (FLONASE) 50 MCG/ACT nasal spray Place 2 sprays into both nostrils daily. 16 g Bing Neighbors, FNP   amoxicillin (AMOXIL) 875 MG tablet Take 1 tablet (875 mg total) by mouth 2 (two) times daily for 7 days. 14 tablet Bing Neighbors, FNP     PDMP not reviewed this encounter.   Bing Neighbors, FNP 08/27/20 [phone]

## 2020-08-24 NOTE — ED Triage Notes (Signed)
Patient presents to Urgent Care with complaints of headache intermittently since 4 days ago. Patient reports she took ibuprofen for it yesterday and it helped but then it wore off and the pain returned.  Pt states familial hx of diabetes, pt is not diabetic to her knowledge.

## 2020-08-24 NOTE — ED Notes (Signed)
Patient called for room x3, called patient on her phone and she said she was coming in from the parking lot. Patient not visualized in lobby after 4th try.

## 2020-08-26 ENCOUNTER — Telehealth: Payer: Self-pay

## 2020-08-26 NOTE — Telephone Encounter (Signed)
Pt was seen 2 days ago and wanted to know if it was ok to take Tylenol with the prescription that she was given?

## 2024-06-23 ENCOUNTER — Emergency Department (HOSPITAL_BASED_OUTPATIENT_CLINIC_OR_DEPARTMENT_OTHER)

## 2024-06-23 ENCOUNTER — Emergency Department (HOSPITAL_BASED_OUTPATIENT_CLINIC_OR_DEPARTMENT_OTHER)
Admission: EM | Admit: 2024-06-23 | Discharge: 2024-06-23 | Disposition: A | Discharge status: Home / Self Care | Attending: Emergency Medicine | Admitting: Emergency Medicine

## 2024-06-23 ENCOUNTER — Other Ambulatory Visit: Payer: Self-pay

## 2024-06-23 ENCOUNTER — Encounter (HOSPITAL_BASED_OUTPATIENT_CLINIC_OR_DEPARTMENT_OTHER): Payer: Self-pay

## 2024-06-23 DIAGNOSIS — R519 Headache, unspecified: Secondary | ICD-10-CM | POA: Insufficient documentation

## 2024-06-23 NOTE — ED Triage Notes (Signed)
 Pt presents via POV c/o headache x3 days. Reports had an episode of full face/neck tingling. Reports took Motrin x1 in the past 3 days for the headache and did not resolve. Denies taking any OTC medication tonight for headache. A&O x4. Ambulatory to triage. GCS 15.

## 2024-06-23 NOTE — ED Provider Notes (Signed)
 " [facility] Provider Note   CSN: [id number] Arrival date & time: 06/23/24  0109     Patient presents with: Headache   Ashley Thompson is a 34 y.o. female.   Patient presents the ER today with intermittent headache for the last few days.  She states that she thinks is probably related to stress and sleep or more accurately lack of sleep.  She states that she just moved here about a month ago.  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  She states that she is having trouble sleeping because she lays awake all night thinking about these things.  Does not exercise much.  No caffeine use.  No alcohol use or tobacco use.  No over-the-counter medications.  States last few days she has had headache in multiple areas of her head.  What worried her that was tonight she had a headache and then she had a tingling feeling and started the top of her head radiate down towards her body.  Was fleeting in nature and has not happened again.  No vision changes.  No neurologic changes.   Headache      Prior to Admission medications  Medication Sig Start Date End Date Taking? Authorizing Provider  fluticasone  (FLONASE ) 50 MCG/ACT nasal spray Place 2 sprays into both nostrils daily. 08/24/20   Arloa Suzen RAMAN, NP  levocetirizine (XYZAL ) 5 MG tablet Take 1 tablet (5 mg total) by mouth every evening. 08/24/20   Arloa Suzen RAMAN, NP    Allergies: Patient has no known allergies.    Review of Systems  Neurological:  Positive for headaches.    Updated Vital Signs BP 116/72   Pulse (!) 101   Temp 99 F (37.2 C) (Oral)   Resp 16   LMP 06/08/2024 (Approximate)   SpO2 100%   Physical Exam Vitals and nursing note reviewed.  Constitutional:      Appearance: She is well-developed.  HENT:     Head: Normocephalic and atraumatic.  Cardiovascular:     Rate and Rhythm: Normal rate and regular rhythm.   Pulmonary:     Effort: No respiratory distress.     Breath sounds: No stridor.  Abdominal:     General: There is no distension.  Musculoskeletal:     Cervical back: Normal range of motion.  Neurological:     Mental Status: She is alert.     Comments: No altered mental status, able to give full seemingly accurate history.  Face is symmetric, EOM's intact, pupils equal and reactive, vision intact, tongue and uvula midline without deviation, hearing not noticeably abnormal. Taste and smell not tested. Upper and Lower extremity motor 5/5, intact pain perception in distal extremities, 2+ reflexes in biceps, patella and achilles tendons. Able to perform finger to nose normal with both hands. Walks without assistance or evident ataxia.       (all labs ordered are listed, but only abnormal results are displayed) Labs Reviewed - No data to display  EKG: None  Radiology: CT Head Wo Contrast Result Date: 06/23/2024 EXAM: CT HEAD WITHOUT CONTRAST 06/23/2024 01:54:00 AM TECHNIQUE: CT of the head was performed without the administration of intravenous contrast. Automated exposure control, iterative reconstruction, and/or weight based adjustment of the mA/kV was utilized to reduce the radiation dose to as low as reasonably achievable. COMPARISON: 4 / 25 / 17. CLINICAL HISTORY: Headache, increasing frequency or severity. FINDINGS: BRAIN  AND VENTRICLES: No acute hemorrhage. No evidence of acute infarct. No hydrocephalus. No extra-axial collection. No mass effect or midline shift. ORBITS: No acute abnormality. SINUSES: No acute abnormality. SOFT TISSUES AND SKULL: No acute soft tissue abnormality. No skull fracture. IMPRESSION: 1. No acute intracranial abnormality. Electronically signed by: Norman Gatlin MD 06/23/2024 01:59 AM EST RP Workstation: [id number]     Procedures   Medications Ordered in the ED - No data to display                                  Medical Decision Making Amount and/or  Complexity of Data Reviewed Radiology: ordered.   CT reassuring.  Neuroexam reassuring.  Counseled at length on stress reduction strategies, sleep hygiene and sleep aids.  Patient is reassured she thinks it is likely her anxiety and stress that are causing her difficulty sleeping and then the headaches are coming from that and I agree with her.  She will return here for any new or worsening symptoms otherwise follow-up with PCP if continues.     Final diagnoses:  Nonintractable headache, unspecified chronicity pattern, unspecified headache type    ED Discharge Orders     None          Selvin Yun, Selinda, MD 06/23/24 [phone]  "
